# Patient Record
Sex: Female | Born: 1965 | Race: Black or African American | Hispanic: No | Marital: Single
Health system: Midwestern US, Community
[De-identification: ages and names within clinical notes are randomized; demographics above are authoritative.]

## PROBLEM LIST (undated history)

## (undated) DIAGNOSIS — I10 Essential (primary) hypertension: Secondary | ICD-10-CM

## (undated) DIAGNOSIS — F32A Depression, unspecified: Secondary | ICD-10-CM

## (undated) DIAGNOSIS — K219 Gastro-esophageal reflux disease without esophagitis: Secondary | ICD-10-CM

## (undated) DIAGNOSIS — F329 Major depressive disorder, single episode, unspecified: Secondary | ICD-10-CM

## (undated) DIAGNOSIS — G43909 Migraine, unspecified, not intractable, without status migrainosus: Secondary | ICD-10-CM

## (undated) DIAGNOSIS — E669 Obesity, unspecified: Secondary | ICD-10-CM

## (undated) DIAGNOSIS — R011 Cardiac murmur, unspecified: Secondary | ICD-10-CM

## (undated) DIAGNOSIS — D259 Leiomyoma of uterus, unspecified: Secondary | ICD-10-CM

## (undated) DIAGNOSIS — H4710 Unspecified papilledema: Secondary | ICD-10-CM

## (undated) DIAGNOSIS — G932 Benign intracranial hypertension: Secondary | ICD-10-CM

## (undated) HISTORY — PX: WISDOM TOOTH EXTRACTION: SHX21

## (undated) HISTORY — PX: CHOLECYSTECTOMY: SHX55

---

## 2001-03-18 ENCOUNTER — Emergency Department (HOSPITAL_COMMUNITY): Admission: EM | Admit: 2001-03-18 | Discharge: 2001-03-18 | Payer: Self-pay | Admitting: Emergency Medicine

## 2001-04-15 ENCOUNTER — Encounter: Payer: Self-pay | Admitting: Emergency Medicine

## 2001-04-15 ENCOUNTER — Emergency Department (HOSPITAL_COMMUNITY): Admission: EM | Admit: 2001-04-15 | Discharge: 2001-04-15 | Payer: Self-pay | Admitting: Emergency Medicine

## 2001-05-21 ENCOUNTER — Emergency Department (HOSPITAL_COMMUNITY): Admission: EM | Admit: 2001-05-21 | Discharge: 2001-05-21 | Payer: Self-pay | Admitting: Emergency Medicine

## 2001-06-07 ENCOUNTER — Inpatient Hospital Stay (HOSPITAL_COMMUNITY): Admission: AD | Admit: 2001-06-07 | Discharge: 2001-06-07 | Payer: Self-pay | Admitting: *Deleted

## 2001-08-04 ENCOUNTER — Emergency Department (HOSPITAL_COMMUNITY): Admission: EM | Admit: 2001-08-04 | Discharge: 2001-08-04 | Payer: Self-pay | Admitting: Emergency Medicine

## 2001-09-26 ENCOUNTER — Emergency Department (HOSPITAL_COMMUNITY): Admission: EM | Admit: 2001-09-26 | Discharge: 2001-09-27 | Payer: Self-pay | Admitting: Emergency Medicine

## 2001-10-24 ENCOUNTER — Emergency Department (HOSPITAL_COMMUNITY): Admission: EM | Admit: 2001-10-24 | Discharge: 2001-10-24 | Payer: Self-pay | Admitting: Emergency Medicine

## 2002-01-02 ENCOUNTER — Encounter: Payer: Self-pay | Admitting: Emergency Medicine

## 2002-01-02 ENCOUNTER — Emergency Department (HOSPITAL_COMMUNITY): Admission: EM | Admit: 2002-01-02 | Discharge: 2002-01-02 | Payer: Self-pay | Admitting: Emergency Medicine

## 2002-02-10 ENCOUNTER — Encounter (INDEPENDENT_AMBULATORY_CARE_PROVIDER_SITE_OTHER): Payer: Self-pay | Admitting: Cardiology

## 2002-02-10 ENCOUNTER — Ambulatory Visit (HOSPITAL_COMMUNITY): Admission: RE | Admit: 2002-02-10 | Discharge: 2002-02-10 | Payer: Self-pay | Admitting: Pulmonary Disease

## 2002-03-31 ENCOUNTER — Emergency Department (HOSPITAL_COMMUNITY): Admission: EM | Admit: 2002-03-31 | Discharge: 2002-03-31 | Payer: Self-pay | Admitting: Emergency Medicine

## 2002-03-31 ENCOUNTER — Encounter: Payer: Self-pay | Admitting: Emergency Medicine

## 2002-04-28 ENCOUNTER — Ambulatory Visit (HOSPITAL_COMMUNITY): Admission: RE | Admit: 2002-04-28 | Discharge: 2002-04-28 | Payer: Self-pay | Admitting: Pulmonary Disease

## 2002-04-28 ENCOUNTER — Encounter: Payer: Self-pay | Admitting: Pulmonary Disease

## 2002-07-09 ENCOUNTER — Ambulatory Visit (HOSPITAL_COMMUNITY): Admission: RE | Admit: 2002-07-09 | Discharge: 2002-07-09 | Payer: Self-pay | Admitting: Pulmonary Disease

## 2002-07-09 ENCOUNTER — Encounter: Payer: Self-pay | Admitting: Pulmonary Disease

## 2002-08-02 ENCOUNTER — Emergency Department (HOSPITAL_COMMUNITY): Admission: EM | Admit: 2002-08-02 | Discharge: 2002-08-02 | Payer: Self-pay | Admitting: Emergency Medicine

## 2002-08-10 ENCOUNTER — Emergency Department (HOSPITAL_COMMUNITY): Admission: EM | Admit: 2002-08-10 | Discharge: 2002-08-10 | Payer: Self-pay | Admitting: *Deleted

## 2002-11-11 ENCOUNTER — Emergency Department (HOSPITAL_COMMUNITY): Admission: AD | Admit: 2002-11-11 | Discharge: 2002-11-11 | Payer: Self-pay | Admitting: Family Medicine

## 2002-11-13 ENCOUNTER — Emergency Department (HOSPITAL_COMMUNITY): Admission: EM | Admit: 2002-11-13 | Discharge: 2002-11-13 | Payer: Self-pay | Admitting: Family Medicine

## 2002-12-03 ENCOUNTER — Emergency Department (HOSPITAL_COMMUNITY): Admission: EM | Admit: 2002-12-03 | Discharge: 2002-12-03 | Payer: Self-pay | Admitting: Emergency Medicine

## 2003-06-09 ENCOUNTER — Emergency Department (HOSPITAL_COMMUNITY): Admission: EM | Admit: 2003-06-09 | Discharge: 2003-06-10 | Payer: Self-pay

## 2003-06-09 ENCOUNTER — Emergency Department (HOSPITAL_COMMUNITY): Admission: EM | Admit: 2003-06-09 | Discharge: 2003-06-09 | Payer: Self-pay | Admitting: Family Medicine

## 2003-07-29 ENCOUNTER — Emergency Department (HOSPITAL_COMMUNITY): Admission: EM | Admit: 2003-07-29 | Discharge: 2003-07-30 | Payer: Self-pay | Admitting: Emergency Medicine

## 2003-10-01 ENCOUNTER — Ambulatory Visit (HOSPITAL_COMMUNITY): Admission: RE | Admit: 2003-10-01 | Discharge: 2003-10-01 | Payer: Self-pay | Admitting: Pulmonary Disease

## 2004-02-23 ENCOUNTER — Ambulatory Visit (HOSPITAL_COMMUNITY): Admission: RE | Admit: 2004-02-23 | Discharge: 2004-02-23 | Payer: Self-pay | Admitting: Pulmonary Disease

## 2004-03-12 ENCOUNTER — Emergency Department (HOSPITAL_COMMUNITY): Admission: EM | Admit: 2004-03-12 | Discharge: 2004-03-13 | Payer: Self-pay | Admitting: *Deleted

## 2004-03-14 ENCOUNTER — Encounter: Admission: RE | Admit: 2004-03-14 | Discharge: 2004-04-20 | Payer: Self-pay | Admitting: Pulmonary Disease

## 2004-03-30 ENCOUNTER — Other Ambulatory Visit: Admission: RE | Admit: 2004-03-30 | Discharge: 2004-03-30 | Payer: Self-pay | Admitting: Obstetrics and Gynecology

## 2004-04-20 ENCOUNTER — Encounter: Admission: RE | Admit: 2004-04-20 | Discharge: 2004-05-12 | Payer: Self-pay | Admitting: Pulmonary Disease

## 2004-06-08 ENCOUNTER — Ambulatory Visit (HOSPITAL_COMMUNITY): Admission: RE | Admit: 2004-06-08 | Discharge: 2004-06-08 | Payer: Self-pay | Admitting: Obstetrics and Gynecology

## 2004-06-08 ENCOUNTER — Encounter (INDEPENDENT_AMBULATORY_CARE_PROVIDER_SITE_OTHER): Payer: Self-pay | Admitting: Specialist

## 2004-09-13 ENCOUNTER — Emergency Department (HOSPITAL_COMMUNITY): Admission: EM | Admit: 2004-09-13 | Discharge: 2004-09-13 | Payer: Self-pay | Admitting: Emergency Medicine

## 2004-09-16 ENCOUNTER — Emergency Department (HOSPITAL_COMMUNITY): Admission: EM | Admit: 2004-09-16 | Discharge: 2004-09-16 | Payer: Self-pay | Admitting: Family Medicine

## 2005-04-02 ENCOUNTER — Emergency Department (HOSPITAL_COMMUNITY): Admission: EM | Admit: 2005-04-02 | Discharge: 2005-04-02 | Payer: Self-pay | Admitting: Family Medicine

## 2005-04-23 ENCOUNTER — Emergency Department (HOSPITAL_COMMUNITY): Admission: EM | Admit: 2005-04-23 | Discharge: 2005-04-23 | Payer: Self-pay | Admitting: Family Medicine

## 2005-06-19 ENCOUNTER — Other Ambulatory Visit: Admission: RE | Admit: 2005-06-19 | Discharge: 2005-06-19 | Payer: Self-pay | Admitting: Obstetrics and Gynecology

## 2005-09-14 ENCOUNTER — Emergency Department (HOSPITAL_COMMUNITY): Admission: EM | Admit: 2005-09-14 | Discharge: 2005-09-14 | Payer: Self-pay | Admitting: Family Medicine

## 2005-11-03 ENCOUNTER — Emergency Department (HOSPITAL_COMMUNITY): Admission: EM | Admit: 2005-11-03 | Discharge: 2005-11-03 | Payer: Self-pay | Admitting: Emergency Medicine

## 2005-11-09 ENCOUNTER — Emergency Department (HOSPITAL_COMMUNITY): Admission: EM | Admit: 2005-11-09 | Discharge: 2005-11-09 | Payer: Self-pay | Admitting: Family Medicine

## 2006-03-19 ENCOUNTER — Ambulatory Visit (HOSPITAL_BASED_OUTPATIENT_CLINIC_OR_DEPARTMENT_OTHER): Admission: RE | Admit: 2006-03-19 | Discharge: 2006-03-19 | Payer: Self-pay | Admitting: Pulmonary Disease

## 2006-03-21 ENCOUNTER — Emergency Department (HOSPITAL_COMMUNITY): Admission: EM | Admit: 2006-03-21 | Discharge: 2006-03-21 | Payer: Self-pay | Admitting: Emergency Medicine

## 2006-03-24 ENCOUNTER — Ambulatory Visit: Payer: Self-pay | Admitting: Internal Medicine

## 2006-05-10 ENCOUNTER — Ambulatory Visit (HOSPITAL_BASED_OUTPATIENT_CLINIC_OR_DEPARTMENT_OTHER): Admission: RE | Admit: 2006-05-10 | Discharge: 2006-05-10 | Payer: Self-pay | Admitting: Pulmonary Disease

## 2006-05-18 ENCOUNTER — Emergency Department (HOSPITAL_COMMUNITY): Admission: EM | Admit: 2006-05-18 | Discharge: 2006-05-18 | Payer: Self-pay | Admitting: Emergency Medicine

## 2006-06-11 ENCOUNTER — Emergency Department (HOSPITAL_COMMUNITY): Admission: EM | Admit: 2006-06-11 | Discharge: 2006-06-11 | Payer: Self-pay | Admitting: Family Medicine

## 2007-04-05 ENCOUNTER — Emergency Department (HOSPITAL_COMMUNITY): Admission: EM | Admit: 2007-04-05 | Discharge: 2007-04-05 | Payer: Self-pay | Admitting: Family Medicine

## 2008-07-18 ENCOUNTER — Emergency Department (HOSPITAL_COMMUNITY): Admission: EM | Admit: 2008-07-18 | Discharge: 2008-07-18 | Payer: Self-pay | Admitting: Emergency Medicine

## 2008-08-22 ENCOUNTER — Emergency Department (HOSPITAL_COMMUNITY): Admission: EM | Admit: 2008-08-22 | Discharge: 2008-08-23 | Payer: Self-pay | Admitting: Emergency Medicine

## 2008-10-05 ENCOUNTER — Emergency Department (HOSPITAL_COMMUNITY): Admission: EM | Admit: 2008-10-05 | Discharge: 2008-10-05 | Payer: Self-pay | Admitting: Emergency Medicine

## 2008-12-27 ENCOUNTER — Emergency Department (HOSPITAL_COMMUNITY): Admission: EM | Admit: 2008-12-27 | Discharge: 2008-12-28 | Payer: Self-pay | Admitting: Emergency Medicine

## 2009-02-17 ENCOUNTER — Emergency Department (HOSPITAL_COMMUNITY): Admission: EM | Admit: 2009-02-17 | Discharge: 2009-02-17 | Payer: Self-pay | Admitting: Family Medicine

## 2009-06-10 ENCOUNTER — Emergency Department (HOSPITAL_COMMUNITY): Admission: EM | Admit: 2009-06-10 | Discharge: 2009-06-10 | Payer: Self-pay | Admitting: Emergency Medicine

## 2010-02-05 ENCOUNTER — Encounter: Payer: Self-pay | Admitting: Neurology

## 2010-02-05 ENCOUNTER — Encounter: Payer: Self-pay | Admitting: Pulmonary Disease

## 2010-04-03 LAB — CBC
Hemoglobin: 12.4 g/dL (ref 12.0–15.0)
MCV: 88.1 fL (ref 78.0–100.0)
RBC: 4.18 MIL/uL (ref 3.87–5.11)

## 2010-04-03 LAB — DIFFERENTIAL
Eosinophils Relative: 3 % (ref 0–5)
Lymphocytes Relative: 21 % (ref 12–46)
Monocytes Absolute: 0.5 10*3/uL (ref 0.1–1.0)
Monocytes Relative: 6 % (ref 3–12)
Neutro Abs: 5.7 10*3/uL (ref 1.7–7.7)

## 2010-04-03 LAB — COMPREHENSIVE METABOLIC PANEL
ALT: 14 U/L (ref 0–35)
Alkaline Phosphatase: 48 U/L (ref 39–117)
Chloride: 102 mEq/L (ref 96–112)
GFR calc Af Amer: >60 mL/min (ref >60)
Glucose, Bld: 117 mg/dL — ABNORMAL HIGH (ref 70–99)
Sodium: 138 mEq/L (ref 135–145)

## 2010-04-03 LAB — CK TOTAL AND CKMB (NOT AT ARMC)
CK, MB: 1.2 ng/mL (ref 0.3–4.0)
Total CK: 99 U/L (ref 7–177)

## 2010-06-01 ENCOUNTER — Emergency Department (HOSPITAL_COMMUNITY)
Admission: EM | Admit: 2010-06-01 | Discharge: 2010-06-02 | Disposition: A | Discharge status: Home / Self Care | Payer: PRIVATE HEALTH INSURANCE | Attending: Emergency Medicine | Admitting: Emergency Medicine

## 2010-06-01 DIAGNOSIS — R110 Nausea: Secondary | ICD-10-CM | POA: Insufficient documentation

## 2010-06-01 DIAGNOSIS — E669 Obesity, unspecified: Secondary | ICD-10-CM | POA: Insufficient documentation

## 2010-06-01 DIAGNOSIS — H53149 Visual discomfort, unspecified: Secondary | ICD-10-CM | POA: Insufficient documentation

## 2010-06-01 DIAGNOSIS — R51 Headache: Secondary | ICD-10-CM | POA: Insufficient documentation

## 2010-06-01 DIAGNOSIS — I10 Essential (primary) hypertension: Secondary | ICD-10-CM | POA: Insufficient documentation

## 2010-06-02 NOTE — Procedures (Signed)
Courtney Rhodes, Courtney Rhodes                ACCOUNT NO.:  [id number]   MEDICAL RECORD NO.:  [id number]          PATIENT TYPE:  OUT   LOCATION:  SLEEP CENTER                 FACILITY:  [facility]   PHYSICIAN:  Clinton D. Maple Hudson, MD, FCCP, FACPDATE OF BIRTH:  1966-08-22   DATE OF STUDY:  05/10/2006                            NOCTURNAL POLYSOMNOGRAM   REFERRING PHYSICIAN:  Mina Marble, M.D.   INDICATION FOR STUDY:  Hypersomnia with sleep apnea.   EPWORTH SLEEPINESS SCORE:  18/24.  Height 5 feet 7 inches, weight 290  pounds.  A baseline diagnostic study on March 21, 2006, recorded an AHI  of 39.9 per hour.  CPAP titration requested.   SLEEP ARCHITECTURE:  Total sleep time 333 minutes with sleep efficiency  93%.  Stage I was 4%, stage II 58%, stages III and IV 2%, REM 37% of  total sleep time.  Sleep latency 16 minutes, REM latency 59 minutes.  Awake after sleep onset 10 minutes, arousal index 0.9.  No bedtime  medication was taken.   RESPIRATORY DATA:  CPAP titration protocol.  CPAP was titrated to 21  CWP.  Obstructive events were prevented by pressures of 17 CWP and  higher.  Pressure was increased to suppress snoring.  A medium ResMed  Quattro was used with heated humidifier.   OXYGEN DATA:  Final CPAP pressure suppressed snoring with oxygen  saturation holding at 98% on room air.   CARDIAC DATA:  Normal sinus rhythm.   MOVEMENT-PARASOMNIA:  Significant movement disturbance.   IMPRESSIONS-RECOMMENDATIONS:  1. CPAP titration required a final pressure of 21 CWP to completely      suppress snoring (AHI zero per hour).  Most pressures provided      substantial event control.  Suggest initial home trial would be      more comfortable and would be able to utilize a standard CPAP      machine and a suggested initial pressure of 17 CWP (AHI zero per      hour).  Pressures higher than 20 are likely to require a bilevel      machine.  A medium ResMed Quattro was chosen for this study.  2.  Baseline diagnostic NPSG on March 21, 2006, had recorded an AHI of      39.9 per hour.     Clinton D. Maple Hudson, MD, [facility], FACP  Diplomate, Biomedical engineer of Sleep Medicine  Electronically Signed    CDY/MEDQ  D:  05/12/2006 11:38:20  T:  05/12/2006 16:27:23  Job:  [id number]

## 2010-06-02 NOTE — Procedures (Signed)
Courtney Rhodes, Courtney Rhodes                ACCOUNT NO.:  [id number]   MEDICAL RECORD NO.:  [id number]          PATIENT TYPE:  OUT   LOCATION:  SLEEP CENTER                 FACILITY:  [facility]   PHYSICIAN:  Clinton D. Maple Hudson, MD, FCCP, FACPDATE OF BIRTH:  12/17/1966   DATE OF STUDY:  03/21/2006                            NOCTURNAL POLYSOMNOGRAM   INDICATION FOR STUDY:  Hypersomnia with sleep apnea.   EPWORTH SLEEPINESS SCORE:  18/24, BMI 44, weight 285 pounds.   MEDICATIONS:  No home medications were listed.   SLEEP ARCHITECTURE:  Total sleep time 305 minutes with sleep efficiency  72%.  Stage 1 was 4%, stage II 88%, stages III and IV absent, REM 8% of  total sleep time.  Sleep latency 111 minutes, REM latency 191 minutes,  awake after sleep onset 11 minutes, arousal index 1.  No bedtime  medication was reported.   RESPIRATORY DATA:  Apnea-hypopnea index (AHI, RDI) 39.9 obstructive  events per hour, indicating moderately severe obstructive sleep  apnea/hypopnea syndrome.  There were 33 obstructive sleep apneas and 170  hypopneas.  Events occurred relatively late in the night, preventing  CPAP titration by split protocol on this study night.  Events were not  positional, although somewhat worse while supine.  REM AHI 52.8.   OXYGEN DATA:  Moderate to loud snoring with oxygen desaturation to a  nadir of 83%.  Mean oxygen saturation through the study was 94% on room  air.   CARDIAC DATA:  Normal sinus rhythm.   MOVEMENT-PARASOMNIA:  No significant limb movement or movement  disturbance.   IMPRESSIONS-RECOMMENDATIONS:  1. Moderately severe obstructive sleep apnea/hypopnea syndrome, apnea-      hypopnea index 39.9 per hour with non-positional events, moderate      to loud snoring and oxygen desaturation to a nadir of 83%.  Sleep      onset was delayed until nearly midnight and patient reported that      she had taken a nap at 5 p.m. despite instructions from the sleep      center before the  study.  2. Onset of events was too late in the study night to permit use of      CPAP titration by split protocol.  Suggest return for continuous      positive airway pressure titration or evaluate for alternative      therapies as appropriate.      Clinton D. Maple Hudson, MD, [facility], FACP  Diplomate, Biomedical engineer of Sleep Medicine  Electronically Signed     CDY/MEDQ  D:  03/24/2006 10:20:22  T:  03/24/2006 17:53:37  Job:  [id number]

## 2010-06-02 NOTE — Op Note (Signed)
NAMERAAGA, MAEDER                ACCOUNT NO.:  [id number]   MEDICAL RECORD NO.:  [id number]          PATIENT TYPE:  AMB   LOCATION:  SDC                           FACILITY:  [facility]   PHYSICIAN:  Osborn Coho, M.D.   DATE OF BIRTH:  [birth year]   DATE OF PROCEDURE:  06/08/2004  DATE OF DISCHARGE:                                 OPERATIVE REPORT   PREOPERATIVE DIAGNOSIS:  Menorrhagia.   POSTOPERATIVE DIAGNOSIS:  Menorrhagia.   PROCEDURE:  Hysteroscopy, dilatation and curettage, and endometrial ablation  via NovaSure.   ATTENDING:  Osborn Coho, M.D.   FLUIDS:  600 mL.   URINE OUTPUT:  Approximately 200 mL via straight catheter prior to  procedure.   HYSTEROSCOPIC FLUID DEFICIT:  110 mL.   ESTIMATED BLOOD LOSS:  Minimal.   ANESTHESIA:  General.   COMPLICATIONS:  None.   SPECIMENS TO PATHOLOGY:  Endometrial curettings.   FINDINGS:  The uterus sounded to 11 cm.  Cervical length 4.5 cm.  Cavity  length 6.5 cm.  Cavity width 4.2 cm.  Power 150 watts.  Time 1 minute 6  seconds.   PROCEDURE:  The patient was taken to the operating room after the risks,  benefits, and alternatives were reviewed with the patient and the patient  verbalized understanding and consent signed and witnessed.  The patient was  placed under general anesthesia in a dorsal lithotomy position and prepped  and draped in the normal sterile fashion.  A bivalve speculum was placed in  the patient's vagina and a total of 10 mL of 1% lidocaine was used to  administer a paracervical block.  A single-tooth tenaculum was placed on the  anterior lip of the cervix and the cervical length and uterine sound as  mentioned above.  The cervix was dilated for passage of the diagnostic  hysteroscope.  Diagnostic hysteroscope introduced and polypoid lesions  noted.  Curettage was performed and curettings sent to pathology.  The  NovaSure instrument was then introduced and cavity assessment passed.  Length of ablation 1  minute 6  seconds.  NovaSure instrument was removed.  The tenaculum was removed as  well.  There was some bleeding from the left tenaculum site, and silver  nitrate was applied for hemostasis.  The count was correct.  All instruments  removed.  The patient tolerated the procedure well and was returned to the  recovery room in good condition.       AR/MEDQ  D:  06/08/2004  T:  06/08/2004  Job:  [id number]

## 2010-06-19 ENCOUNTER — Other Ambulatory Visit (HOSPITAL_COMMUNITY): Payer: Self-pay | Admitting: Pulmonary Disease

## 2010-06-19 DIAGNOSIS — Z1231 Encounter for screening mammogram for malignant neoplasm of breast: Secondary | ICD-10-CM

## 2010-06-21 ENCOUNTER — Ambulatory Visit (HOSPITAL_COMMUNITY): Admission: RE | Admit: 2010-06-21 | Payer: PRIVATE HEALTH INSURANCE | Source: Ambulatory Visit

## 2010-06-21 ENCOUNTER — Ambulatory Visit
Admission: RE | Admit: 2010-06-21 | Discharge: 2010-06-21 | Disposition: A | Discharge status: Home / Self Care | Payer: PRIVATE HEALTH INSURANCE | Source: Ambulatory Visit | Attending: Pulmonary Disease | Admitting: Pulmonary Disease

## 2010-06-21 ENCOUNTER — Other Ambulatory Visit: Payer: Self-pay | Admitting: Pulmonary Disease

## 2010-06-21 DIAGNOSIS — Z1231 Encounter for screening mammogram for malignant neoplasm of breast: Secondary | ICD-10-CM

## 2010-06-30 ENCOUNTER — Ambulatory Visit (HOSPITAL_COMMUNITY): Payer: PRIVATE HEALTH INSURANCE

## 2012-01-19 ENCOUNTER — Encounter (HOSPITAL_COMMUNITY): Payer: Self-pay | Admitting: Emergency Medicine

## 2012-01-19 ENCOUNTER — Observation Stay (HOSPITAL_COMMUNITY)
Admission: EM | Admit: 2012-01-19 | Discharge: 2012-01-20 | Disposition: A | Discharge status: Home / Self Care | Payer: Medicaid Other | Attending: Family Medicine | Admitting: Family Medicine

## 2012-01-19 ENCOUNTER — Emergency Department (HOSPITAL_COMMUNITY): Payer: PRIVATE HEALTH INSURANCE

## 2012-01-19 DIAGNOSIS — Z79899 Other long term (current) drug therapy: Secondary | ICD-10-CM | POA: Insufficient documentation

## 2012-01-19 DIAGNOSIS — G43909 Migraine, unspecified, not intractable, without status migrainosus: Secondary | ICD-10-CM | POA: Insufficient documentation

## 2012-01-19 DIAGNOSIS — I517 Cardiomegaly: Secondary | ICD-10-CM | POA: Insufficient documentation

## 2012-01-19 DIAGNOSIS — M549 Dorsalgia, unspecified: Secondary | ICD-10-CM | POA: Insufficient documentation

## 2012-01-19 DIAGNOSIS — R110 Nausea: Secondary | ICD-10-CM | POA: Insufficient documentation

## 2012-01-19 DIAGNOSIS — I519 Heart disease, unspecified: Secondary | ICD-10-CM

## 2012-01-19 DIAGNOSIS — K219 Gastro-esophageal reflux disease without esophagitis: Secondary | ICD-10-CM | POA: Diagnosis present

## 2012-01-19 DIAGNOSIS — I5189 Other ill-defined heart diseases: Secondary | ICD-10-CM

## 2012-01-19 DIAGNOSIS — R350 Frequency of micturition: Secondary | ICD-10-CM | POA: Insufficient documentation

## 2012-01-19 DIAGNOSIS — I079 Rheumatic tricuspid valve disease, unspecified: Secondary | ICD-10-CM | POA: Insufficient documentation

## 2012-01-19 DIAGNOSIS — I10 Essential (primary) hypertension: Secondary | ICD-10-CM | POA: Diagnosis present

## 2012-01-19 DIAGNOSIS — R079 Chest pain, unspecified: Principal | ICD-10-CM | POA: Diagnosis present

## 2012-01-19 DIAGNOSIS — E876 Hypokalemia: Secondary | ICD-10-CM | POA: Insufficient documentation

## 2012-01-19 HISTORY — DX: Essential (primary) hypertension: I10

## 2012-01-19 HISTORY — DX: Cardiac murmur, unspecified: R01.1

## 2012-01-19 LAB — CBC
HCT: 35.9 % — ABNORMAL LOW (ref 36.0–46.0)
Hemoglobin: 12.1 g/dL (ref 12.0–15.0)
RDW: 13.9 % (ref 11.5–15.5)
WBC: 9.1 10*3/uL (ref 4.0–10.5)

## 2012-01-19 LAB — TROPONIN I: Troponin I: <0.3 ng/mL (ref <0.30)

## 2012-01-19 LAB — URINALYSIS, ROUTINE W REFLEX MICROSCOPIC
Glucose, UA: NEGATIVE mg/dL
Hgb urine dipstick: NEGATIVE

## 2012-01-19 LAB — CK TOTAL AND CKMB (NOT AT ARMC): Total CK: 73 U/L (ref 7–177)

## 2012-01-19 LAB — CREATININE, SERUM
Creatinine, Ser: 0.7 mg/dL (ref 0.50–1.10)
GFR calc Af Amer: >90 mL/min (ref >90)
GFR calc non Af Amer: >90 mL/min (ref >90)

## 2012-01-19 LAB — CBC WITH DIFFERENTIAL/PLATELET
Basophils Relative: 0 % (ref 0–1)
Eosinophils Absolute: 0.3 10*3/uL (ref 0.0–0.7)
Lymphocytes Relative: 31 % (ref 12–46)
MCH: 29.3 pg (ref 26.0–34.0)
MCV: 87.3 fL (ref 78.0–100.0)
Monocytes Relative: 6 % (ref 3–12)
Platelets: 357 10*3/uL (ref 150–400)
RDW: 13.9 % (ref 11.5–15.5)

## 2012-01-19 LAB — PRO B NATRIURETIC PEPTIDE: Pro B Natriuretic peptide (BNP): 269.7 pg/mL — ABNORMAL HIGH (ref 0–125)

## 2012-01-19 LAB — D-DIMER, QUANTITATIVE: D-Dimer, Quant: 0.33 ug/mL-FEU (ref 0.00–0.48)

## 2012-01-19 LAB — BASIC METABOLIC PANEL
CO2: 28 mEq/L (ref 19–32)
Chloride: 99 mEq/L (ref 96–112)
GFR calc Af Amer: >90 mL/min (ref >90)
GFR calc non Af Amer: >90 mL/min (ref >90)
Glucose, Bld: 136 mg/dL — ABNORMAL HIGH (ref 70–99)

## 2012-01-19 MED ORDER — ALUM & MAG HYDROXIDE-SIMETH 200-200-20 MG/5ML PO SUSP
30.0000 mL | Freq: Once | ORAL | Status: AC
  Administered 2012-01-19: 30 mL via ORAL
  Filled 2012-01-19: qty 30

## 2012-01-19 MED ORDER — ONDANSETRON HCL 4 MG PO TABS
4.0000 mg | ORAL_TABLET | Freq: Four times a day (QID) | ORAL | Status: DC | PRN
  Administered 2012-01-19: 4 mg via ORAL
  Filled 2012-01-19: qty 1

## 2012-01-19 MED ORDER — MORPHINE SULFATE 2 MG/ML IJ SOLN
1.0000 mg | INTRAMUSCULAR | Status: DC | PRN
  Administered 2012-01-19 – 2012-01-20 (×4): 1 mg via INTRAVENOUS
  Filled 2012-01-19 (×5): qty 1

## 2012-01-19 MED ORDER — ENOXAPARIN SODIUM 40 MG/0.4ML ~~LOC~~ SOLN
40.0000 mg | SUBCUTANEOUS | Status: DC
  Administered 2012-01-19 – 2012-01-20 (×2): 40 mg via SUBCUTANEOUS
  Filled 2012-01-19 (×2): qty 0.4

## 2012-01-19 MED ORDER — ASPIRIN EC 81 MG PO TBEC
81.0000 mg | DELAYED_RELEASE_TABLET | Freq: Every day | ORAL | Status: DC
  Administered 2012-01-19 – 2012-01-20 (×2): 81 mg via ORAL
  Filled 2012-01-19 (×2): qty 1

## 2012-01-19 MED ORDER — SODIUM CHLORIDE 0.9 % IJ SOLN: 3.0000 mL | INTRAMUSCULAR | Status: DC | PRN

## 2012-01-19 MED ORDER — MORPHINE SULFATE 4 MG/ML IJ SOLN
4.0000 mg | Freq: Once | INTRAMUSCULAR | Status: AC
  Administered 2012-01-19: 4 mg via INTRAVENOUS
  Filled 2012-01-19: qty 1

## 2012-01-19 MED ORDER — PANTOPRAZOLE SODIUM 40 MG IV SOLR
40.0000 mg | INTRAVENOUS | Status: DC
  Administered 2012-01-19 – 2012-01-20 (×2): 40 mg via INTRAVENOUS
  Filled 2012-01-19 (×2): qty 40

## 2012-01-19 MED ORDER — NITROGLYCERIN 0.4 MG SL SUBL
0.4000 mg | SUBLINGUAL_TABLET | Freq: Once | SUBLINGUAL | Status: AC
  Administered 2012-01-19: 0.4 mg via SUBLINGUAL
  Filled 2012-01-19: qty 25

## 2012-01-19 MED ORDER — MORPHINE SULFATE 2 MG/ML IJ SOLN
1.0000 mg | Freq: Once | INTRAMUSCULAR | Status: AC
Start: 2012-01-19 — End: 2012-01-19
  Administered 2012-01-19: 1 mg via INTRAVENOUS

## 2012-01-19 MED ORDER — SODIUM CHLORIDE 0.9 % IV SOLN: 250.0000 mL | INTRAVENOUS | Status: DC | PRN

## 2012-01-19 MED ORDER — ONDANSETRON HCL 4 MG/2ML IJ SOLN: 4.0000 mg | Freq: Four times a day (QID) | INTRAMUSCULAR | Status: DC | PRN

## 2012-01-19 MED ORDER — POTASSIUM CHLORIDE 20 MEQ/15ML (10%) PO LIQD
40.0000 meq | Freq: Once | ORAL | Status: AC
  Administered 2012-01-19: 40 meq via ORAL
  Filled 2012-01-19: qty 30

## 2012-01-19 MED ORDER — POTASSIUM CHLORIDE 20 MEQ/15ML (10%) PO LIQD
40.0000 meq | Freq: Every day | ORAL | Status: DC
  Administered 2012-01-19 – 2012-01-20 (×2): 40 meq via ORAL
  Filled 2012-01-19 (×2): qty 30

## 2012-01-19 MED ORDER — METOPROLOL TARTRATE 50 MG PO TABS
50.0000 mg | ORAL_TABLET | Freq: Two times a day (BID) | ORAL | Status: DC
  Administered 2012-01-19 – 2012-01-20 (×3): 50 mg via ORAL
  Filled 2012-01-19 (×4): qty 1

## 2012-01-19 MED ORDER — SODIUM CHLORIDE 0.9 % IV SOLN
INTRAVENOUS | Status: DC
  Administered 2012-01-19: 125 mL/h via INTRAVENOUS

## 2012-01-19 MED ORDER — POTASSIUM CHLORIDE 10 MEQ/100ML IV SOLN
10.0000 meq | INTRAVENOUS | Status: AC
  Administered 2012-01-19 (×2): 10 meq via INTRAVENOUS
  Filled 2012-01-19 (×2): qty 100

## 2012-01-19 MED ORDER — SODIUM CHLORIDE 0.9 % IJ SOLN
3.0000 mL | Freq: Two times a day (BID) | INTRAMUSCULAR | Status: DC
  Administered 2012-01-19 – 2012-01-20 (×3): 3 mL via INTRAVENOUS

## 2012-01-19 NOTE — ED Notes (Signed)
MD at bedside. 

## 2012-01-19 NOTE — ED Notes (Signed)
Pt requesting not to be admitted. Will notify admitting MD

## 2012-01-19 NOTE — H&P (Signed)
PCP:   No primary provider on file.   Chief Complaint:  Chest pain  HPI: 47 year old female with a history of hypertension who came to the hospital after patient woke up with left-sided chest pain which was radiating to her back, patient also complained of excessive urination within one hour. Patient experienced some mild shortness of breath when she came to the hospital. She admits to having nausea but no vomiting. She denies fever. At this time the pain has improved. Her facet of cardiac enzymes are negative EKG does not show significant abnormality.  Allergies:   Allergies  Allergen Reactions  . Ace Inhibitors       Past Medical History  Diagnosis Date  . Hypertension     History reviewed. No pertinent past surgical history.  Prior to Admission medications   Medication Sig Start Date End Date Taking? Authorizing Provider  metoprolol (LOPRESSOR) 100 MG tablet Take 100 mg by mouth daily.    Yes Historical Provider, MD    Social History:  reports that she has been smoking Cigarettes.  She has a 5 pack-year smoking history. She does not have any smokeless tobacco history on file. She reports that she drinks alcohol. She reports that she does not use illicit drugs.  Patient's father has significant cardiac history of MI  Review of Systems:  HEENT: Denies headache, blurred vision, runny nose, sore throat,  Neck: Denies thyroid problems,lymphadenopathy Chest : See history of present illness Heart : See history of present illness GI: Positive nausea, denies vomiting, diarrhea, constipation GU: Denies dysuria, urgency, positive frequency of urination, hematuria Neuro: Denies stroke, seizures, syncope Psych: Denies depression, anxiety, hallucinations   Physical Exam: Blood pressure 145/62, pulse 53, resp. rate 16, height 5\' 7"  (1.702 m), weight 131.543 kg (290 lb), last menstrual period 01/14/2012, SpO2 100.00%. Constitutional:   Patient is a well-developed and well-nourished  female in no acute distress and cooperative with exam. Head: Normocephalic and atraumatic Mouth: Mucus membranes moist Eyes: PERRL, EOMI, conjunctivae normal Neck: Supple, No Thyromegaly Cardiovascular: RRR, S1 normal, S2 normal Pulmonary/Chest: CTAB, no wheezes, rales, or rhonchi Abdominal: Soft. Non-tender, non-distended, bowel sounds are normal, no masses, organomegaly, or guarding present.  Neurological: A&O x3, Strenght is normal and symmetric bilaterally, cranial nerve II-XII are grossly intact, no focal motor deficit, sensory intact to light touch bilaterally.  Extremities : No Cyanosis, Clubbing or Edema   Labs on Admission:  Results for orders placed during the hospital encounter of 01/19/12 (from the past 48 hour(s))  CBC WITH DIFFERENTIAL     Status: Normal   Collection Time   01/19/12  6:05 AM      Component Value Range Comment   WBC 7.9  4.0 - 10.5 K/uL    RBC 4.26  3.87 - 5.11 MIL/uL    Hemoglobin 12.5  12.0 - 15.0 g/dL    HCT 16.1  09.6 - 04.5 %    MCV 87.3  78.0 - 100.0 fL    MCH 29.3  26.0 - 34.0 pg    MCHC 33.6  30.0 - 36.0 g/dL    RDW 40.9  81.1 - 91.4 %    Platelets 357  150 - 400 K/uL    Neutrophils Relative 59  43 - 77 %    Neutro Abs 4.7  1.7 - 7.7 K/uL    Lymphocytes Relative 31  12 - 46 %    Lymphs Abs 2.4  0.7 - 4.0 K/uL    Monocytes Relative 6  3 -  12 %    Monocytes Absolute 0.5  0.1 - 1.0 K/uL    Eosinophils Relative 3  0 - 5 %    Eosinophils Absolute 0.3  0.0 - 0.7 K/uL    Basophils Relative 0  0 - 1 %    Basophils Absolute 0.0  0.0 - 0.1 K/uL   D-DIMER, QUANTITATIVE     Status: Normal   Collection Time   01/19/12  6:05 AM      Component Value Range Comment   D-Dimer, Quant 0.33  0.00 - 0.48 ug/mL-FEU   BASIC METABOLIC PANEL     Status: Abnormal   Collection Time   01/19/12  6:05 AM      Component Value Range Comment   Sodium 136  135 - 145 mEq/L    Potassium 3.1 (*) 3.5 - 5.1 mEq/L    Chloride 99  96 - 112 mEq/L    CO2 28  19 - 32 mEq/L     Glucose, Bld 136 (*) 70 - 99 mg/dL    BUN 9  6 - 23 mg/dL    Creatinine, Ser 1.61  0.50 - 1.10 mg/dL    Calcium 9.3  8.4 - 09.6 mg/dL    GFR calc non Af Amer >90  >90 mL/min    GFR calc Af Amer >90  >90 mL/min   TROPONIN I     Status: Normal   Collection Time   01/19/12  6:05 AM      Component Value Range Comment   Troponin I <0.30  <0.30 ng/mL   URINALYSIS, ROUTINE W REFLEX MICROSCOPIC     Status: Abnormal   Collection Time   01/19/12  6:24 AM      Component Value Range Comment   Color, Urine YELLOW  YELLOW    APPearance CLEAR  CLEAR    Specific Gravity, Urine 1.013  1.005 - 1.030    pH 6.5  5.0 - 8.0    Glucose, UA NEGATIVE  NEGATIVE mg/dL    Hgb urine dipstick NEGATIVE  NEGATIVE    Bilirubin Urine MODERATE (*) NEGATIVE    Ketones, ur NEGATIVE  NEGATIVE mg/dL    Protein, ur NEGATIVE  NEGATIVE mg/dL    Urobilinogen, UA 0.2  0.0 - 1.0 mg/dL    Nitrite NEGATIVE  NEGATIVE    Leukocytes, UA NEGATIVE  NEGATIVE MICROSCOPIC NOT DONE ON URINES WITH NEGATIVE PROTEIN, BLOOD, LEUKOCYTES, NITRITE, OR GLUCOSE <1000 mg/dL.  PREGNANCY, URINE     Status: Normal   Collection Time   01/19/12  6:24 AM      Component Value Range Comment   Preg Test, Ur NEGATIVE  NEGATIVE     Radiological Exams on Admission: Dg Chest 2 View  01/19/2012  *RADIOLOGY REPORT*  Clinical Data: Left chest pain.  Shortness of breath.  CHEST - 2 VIEW  Comparison: 06/10/2009  Findings: Mild cardiomegaly is stable.  Both lungs are clear.  No evidence of pleural effusion.  No mass or lymphadenopathy identified.  IMPRESSION: Stable mild cardiomegaly.  No active lung disease.   Original Report Authenticated By: Myles Rosenthal, M.D.     Assessment/Plan  Chest pain rule out acute coronary syndrome Cardiomegaly Hypertension GERD  Chest pain Will admit the patient and do 3 sets of cardiac enzymes. We'll monitor her in telemetry. Patient had a recent cardiac stress test in October which was negative  Cardiomegaly  Chest x-ray shows  cardiomegaly Will obtain BNP and 2-D echo to rule out CHF  Hypertension Continue  with metoprolol, and change the dose to 50 mg by mouth twice a day instead of 100 mg of metoprolol tartrate  GERD Chronic 40 mg IV daily  DVT prophylaxis Lovenox  Time Spent on Admission: 70 minutes  Peachie Barkalow S [facility] Pager: [phone] 01/19/2012, 8:58 AM

## 2012-01-19 NOTE — ED Provider Notes (Signed)
History     CSN: [id number]  Arrival date & time 01/19/12  0542   First MD Initiated Contact with Patient 01/19/12 [phone]      Chief Complaint  Patient presents with  . Chest Pain    (Consider location/radiation/quality/duration/timing/severity/associated sxs/prior treatment) Patient is a 47 y.o. female presenting with chest pain. The history is provided by the patient.  Chest Pain The chest pain began less than 1 hour ago. Primary symptoms include shortness of breath. Pertinent negatives for primary symptoms include no abdominal pain, no nausea and no vomiting.  Pertinent negatives for associated symptoms include no numbness and no weakness.    patient woke with severe left-sided chest pain this morning. It is somewhat sharp. It is constant. She states it is in the left lower chest and does do to the back. She's also had some shortness of breath. She states she had a similar episode 3 months ago that resolved on its own. No fevers. No cough. She has some chronic swelling in her legs. No abdominal pain. She smokes. She states she has a history of hypertension. She states she has a cardiologist that she is supposed to see on Monday in [place]. She states she has never had heart attack. She does not think she has other heart problems, but states she doesn't know.  Past Medical History  Diagnosis Date  . Hypertension   . Heart murmur     History reviewed. No pertinent past surgical history.  History reviewed. No pertinent family history.  History  Substance Use Topics  . Smoking status: Current Every Day Smoker -- 1.0 packs/day for 5 years    Types: Cigarettes  . Smokeless tobacco: Not on file  . Alcohol Use: Yes     Comment: occasional    OB History    Grav Para Term Preterm Abortions TAB SAB Ect Mult Living                  Review of Systems  Constitutional: Negative for activity change and appetite change.  HENT: Negative for neck stiffness.   Eyes: Negative for pain.    Respiratory: Positive for shortness of breath. Negative for chest tightness.   Cardiovascular: Positive for chest pain and leg swelling.  Gastrointestinal: Negative for nausea, vomiting, abdominal pain and diarrhea.  Genitourinary: Negative for flank pain.  Musculoskeletal: Negative for back pain.  Skin: Negative for rash.  Neurological: Negative for weakness, numbness and headaches.  Psychiatric/Behavioral: Negative for behavioral problems.    Allergies  Ace inhibitors  Home Medications   Current Outpatient Rx  Name  Route  Sig  Dispense  Refill  . AMLODIPINE BESYLATE 10 MG PO TABS   Oral   Take 10 mg by mouth daily.         . ASPIRIN EC 81 MG PO TBEC   Oral   Take 81 mg by mouth daily.         Marland Kitchen BUTALBITAL-APAP-CAFFEINE 50-325-40 MG PO TABS   Oral   Take 1 tablet by mouth as directed. 15 tablets must last 30 days         . BUTORPHANOL TARTRATE 10 MG/ML NA SOLN   Nasal   Place 1 spray into the nose every 4 (four) hours as needed. pain         . CLONIDINE HCL 0.3 MG PO TABS   Oral   Take 0.3 mg by mouth 2 (two) times daily.         Marland Kitchen VITAMIN  B 12 PO   Oral   Take 1 mL by mouth daily.         . CYCLOBENZAPRINE HCL 10 MG PO TABS   Oral   Take 10 mg by mouth at bedtime.         Marland Kitchen DIAZEPAM 5 MG PO TABS   Oral   Take 5-10 mg by mouth 2 (two) times daily. Takes 1 tablet in the morning and 2 at bedtime         . ETODOLAC 500 MG PO TABS   Oral   Take 500 mg by mouth 2 (two) times daily.         Marland Kitchen FERROUS FUMARATE 325 (106 FE) MG PO TABS   Oral   Take 1 tablet by mouth.         . FLUOXETINE HCL 40 MG PO CAPS   Oral   Take 40 mg by mouth 2 (two) times daily.          Marland Kitchen FLUTICASONE PROPIONATE 50 MCG/ACT NA SUSP   Nasal   Place 2 sprays into the nose 2 (two) times daily.          Marland Kitchen FOLIC ACID 1 MG PO TABS   Oral   Take 1 mg by mouth daily.         Marland Kitchen GABAPENTIN 300 MG PO CAPS   Oral   Take 300 mg by mouth 3 (three) times daily.          Marland Kitchen HYDRALAZINE HCL 10 MG PO TABS   Oral   Take 10 mg by mouth 3 (three) times daily.         Marland Kitchen HYDROCHLOROTHIAZIDE 25 MG PO TABS   Oral   Take 25 mg by mouth daily.         Marland Kitchen METOPROLOL SUCCINATE ER 50 MG PO TB24   Oral   Take 50 mg by mouth daily. Take with or immediately following a meal.         . MIRTAZAPINE 15 MG PO TABS   Oral   Take 15 mg by mouth at bedtime.         . ADULT MULTIVITAMIN W/MINERALS CH   Oral   Take 1 tablet by mouth daily.         Marland Kitchen OMEPRAZOLE 20 MG PO CPDR   Oral   Take 20 mg by mouth daily.         Marland Kitchen POTASSIUM CHLORIDE CRYS ER 20 MEQ PO TBCR   Oral   Take 20 mEq by mouth 2 (two) times daily.         Marland Kitchen PRAMIPEXOLE DIHYDROCHLORIDE 0.125 MG PO TABS   Oral   Take 0.125 mg by mouth 3 (three) times daily.         Marland Kitchen VITAMIN D (ERGOCALCIFEROL) 50000 UNITS PO CAPS   Oral   Take 50,000 Units by mouth every 7 (seven) days.          Marland Kitchen HYDROCODONE-ACETAMINOPHEN 5-325 MG PO TABS   Oral   Take 1 tablet by mouth every 8 (eight) hours as needed. Pain   30 tablet   0   . LOSARTAN POTASSIUM 100 MG PO TABS   Oral   Take 1 tablet (100 mg total) by mouth daily.   15 tablet   0     BP 149/62  Pulse 69  Temp 98 F (36.7 C) (Oral)  Resp 16  Ht 5\' 7"  (1.702 m)  Wt 290  lb (131.543 kg)  BMI 45.42 kg/m2  SpO2 99%  LMP 01/14/2012  Physical Exam  Nursing note and vitals reviewed. Constitutional: She is oriented to person, place, and time. She appears well-developed and well-nourished.  HENT:  Head: Normocephalic and atraumatic.  Eyes: EOM are normal. Pupils are equal, round, and reactive to light.  Neck: Normal range of motion. Neck supple.  Cardiovascular: Normal rate, regular rhythm and normal heart sounds.   No murmur heard. Pulmonary/Chest: Effort normal and breath sounds normal. No respiratory distress. She has no wheezes. She has no rales.  Abdominal: Soft. Bowel sounds are normal. She exhibits no distension. There is  no tenderness. There is no rebound and no guarding.  Musculoskeletal: Normal range of motion.  Neurological: She is alert and oriented to person, place, and time. No cranial nerve deficit.  Skin: Skin is warm and dry.  Psychiatric: She has a normal mood and affect. Her speech is normal.    ED Course  Procedures (including critical care time)  Labs Reviewed  BASIC METABOLIC PANEL - Abnormal; Notable for the following:    Potassium 3.1 (*)     Glucose, Bld 136 (*)     All other components within normal limits  URINALYSIS, ROUTINE W REFLEX MICROSCOPIC - Abnormal; Notable for the following:    Bilirubin Urine MODERATE (*)     All other components within normal limits  CBC - Abnormal; Notable for the following:    HCT 35.9 (*)     All other components within normal limits  PRO B NATRIURETIC PEPTIDE - Abnormal; Notable for the following:    Pro B Natriuretic peptide (BNP) 269.7 (*)     All other components within normal limits  CBC WITH DIFFERENTIAL  D-DIMER, QUANTITATIVE  TROPONIN I  PREGNANCY, URINE  CREATININE, SERUM  TROPONIN I  TROPONIN I  CK TOTAL AND CKMB  TROPONIN I  BASIC METABOLIC PANEL  LAB REPORT - SCANNED   No results found.   1. Chest pain   2. GERD (gastroesophageal reflux disease)   3. Hypertension   4. Diastolic dysfunction      Date: 01/19/2012  Rate: 53  Rhythm: sinus bradycardia  QRS Axis: normal  Intervals: normal  ST/T Wave abnormalities: normal  Conduction Disutrbances:none  Narrative Interpretation:   Old EKG Reviewed: unchanged    MDM  Patient with sharp left-sided chest pain. It is somewhat reproducible. EKG is reassuring except for bradycardia. She continues to have pain after treatment. She has an unknown cardiac history. She is a smoker and is obese. She'll be admitted to medicine for further evaluation        Juliet Rude. Rubin Payor, MD 01/21/12 2321

## 2012-01-19 NOTE — ED Notes (Signed)
Pt. Came in per POV with complaint of chest pain @ 10/10  that started an hour ago prior to coming in, claimed Nauseous but denied vomiting. Also reported SOB. Pt. Had history of chest pain 3 months ago as claimed.

## 2012-01-19 NOTE — Progress Notes (Signed)
[facility] progress note. Chief complaint. Chest pain. History of present illness. This 47 year old female came to the hospital and was admitted with complaints of left-sided chest pain radiating to the back. There is also some mild shortness of breath. He was nausea but no vomiting. Patient was admitted with chest pain rule out acute coronary syndrome. Patient hadn't at 3 sets of troponin and all these negative so far. Patient began complaining of central chest pain and I requested a stat 12-lead EKG. EKG indicates sinus bradycardia without suggestion of ischemia. I also ordered to 1 mg of morphine for the patient's pain. I came to see the patient at the bedside he states there was a little effect with the morphine. She does indicate she has an extensive history of GERD and states that this may be the problem. She usually takes Prilosec for this when needed. Patient describes the pain as sharp and localized under the left breast to the epigastric area. There is radiation of the pain to the back to the left shoulder left jaw. She states she had a hot flash with some diaphoresis. Denies nausea or vomiting. Vital signs. Temperature 90.8, pulse 55, respiration 18, blood pressure 153/85. O2 sats 100%. General appearance. Obese middle-aged female is alert, cooperative, and in no distress. Cardiac. Rate and rhythm regular/mildly bradycardic with apical rate about 88 per minute. Lungs. Diminished midlung to base bilaterally. Patient appears stable on CPAP. Abdomen. Soft and obese with positive bowel sounds. No pain. Impression/plan. Problem #1. Chest pain. Given the normal troponin's and unremarkable EKG's a day for any acute coronary ischemia. The patient history this may well be indigestion/GERD. I will via the patient with a dose of Maalox to see if this might help her discomfort. Will obtain 1 further set of troponin later this a.m. We'll also repeat a 12-lead EKG later this a.m. Patient currently  appears clinically stable.

## 2012-01-20 DIAGNOSIS — I5189 Other ill-defined heart diseases: Secondary | ICD-10-CM

## 2012-01-20 DIAGNOSIS — I517 Cardiomegaly: Secondary | ICD-10-CM

## 2012-01-20 LAB — BASIC METABOLIC PANEL
CO2: 29 mEq/L (ref 19–32)
Chloride: 100 mEq/L (ref 96–112)
Creatinine, Ser: 0.74 mg/dL (ref 0.50–1.10)
GFR calc Af Amer: >90 mL/min (ref >90)
Potassium: 3.5 mEq/L (ref 3.5–5.1)

## 2012-01-20 LAB — TROPONIN I: Troponin I: <0.3 ng/mL (ref <0.30)

## 2012-01-20 MED ORDER — LOSARTAN POTASSIUM 100 MG PO TABS: 100.0000 mg | ORAL_TABLET | Freq: Every day | ORAL | Status: AC

## 2012-01-20 MED ORDER — HYDROCODONE-ACETAMINOPHEN 5-325 MG PO TABS: 1.0000 | ORAL_TABLET | Freq: Three times a day (TID) | ORAL | Status: DC | PRN

## 2012-01-20 MED ORDER — HYDROCODONE-ACETAMINOPHEN 5-325 MG PO TABS
1.0000 | ORAL_TABLET | ORAL | Status: DC | PRN
  Administered 2012-01-20: 2 via ORAL

## 2012-01-20 MED ORDER — PANTOPRAZOLE SODIUM 40 MG PO TBEC: 40.0000 mg | DELAYED_RELEASE_TABLET | Freq: Every day | ORAL | Status: DC

## 2012-01-20 MED ORDER — HYDROCHLOROTHIAZIDE 25 MG PO TABS
25.0000 mg | ORAL_TABLET | Freq: Every day | ORAL | Status: DC
  Administered 2012-01-20: 25 mg via ORAL
  Filled 2012-01-20: qty 1

## 2012-01-20 NOTE — Progress Notes (Signed)
  Echocardiogram 2D Echocardiogram has been performed.  Courtney Rhodes 01/20/2012, 9:06 AM

## 2012-01-20 NOTE — Discharge Summary (Signed)
Physician Discharge Summary  EZMA REHM ZOX:[id number] DOB: August 13, 1965 DOA: 01/19/2012  PCP: No primary provider on file.  Admit date: 01/19/2012 Discharge date: 01/20/2012  Time spent: 50  minutes  Recommendations for Outpatient Follow-up:  1. Follow up with PCP in one week  Discharge Diagnoses:  Active Problems:  Chest pain  Hypertension  GERD (gastroesophageal reflux disease)   Discharge Condition: Stable  Diet recommendation: Low salt diet  Filed Weights   01/19/12 0625  Weight: 131.543 kg (290 lb)    History of present illness:  47 year old female with a history of hypertension who came to the hospital after patient woke up with left-sided chest pain which was radiating to her back, patient also complained of excessive urination within one hour. Patient experienced some mild shortness of breath when she came to the hospital. She admits to having nausea but no vomiting. She denies fever. At this time the pain has improved. Her facet of cardiac enzymes are negative EKG does not show significant abnormality.   Hospital Course:   Chest pain Patient was admitted for observation, cardiac enzymes were cycled which were negative. The EKG did not show any significant abnormality. Patient had elevated BNP so 2-D echo was done which showed grade 2 diastolic dysfunction with EF of 65-70%. Patient will continue to take hydrochlorothiazide 25 mg by mouth daily.  Hypertension Blood pressure is stable, she will continue take her home regimen  Migraines Continue fiorcet, Vicodin  Hypokalemia Replace potassium before discharge  Procedures:  2-D echo which showed EF 65-70% and grade 2 diastolic dysfunction he he  Consultations:  None  Discharge Exam: Filed Vitals:   01/20/12 0608 01/20/12 0926 01/20/12 1135 01/20/12 1319  BP: 153/87 172/92 149/77 149/62  Pulse: 58 67 62 69  Temp: 97.1 F (36.2 C)   98 F (36.7 C)  TempSrc: Oral   Oral  Resp: 18   16  Height:        Weight:      SpO2: 96%  97% 99%    General: In no acute distress Cardiovascular: S1-S2 regular Respiratory: Clear bilaterally Extremities: No edema  Discharge Instructions  Discharge Orders    Future Orders Please Complete By Expires   Diet - low sodium heart healthy      Increase activity slowly          Medication List     As of 01/20/2012  1:44 PM    STOP taking these medications         HYDROcodone-acetaminophen 5-500 MG per tablet   Commonly known as: VICODIN      TAKE these medications         amLODipine 10 MG tablet   Commonly known as: NORVASC   Take 10 mg by mouth daily.      aspirin EC 81 MG tablet   Take 81 mg by mouth daily.      butalbital-acetaminophen-caffeine 50-325-40 MG per tablet   Commonly known as: FIORICET, ESGIC   Take 1 tablet by mouth as directed. 15 tablets must last 30 days      butorphanol 10 MG/ML nasal spray   Commonly known as: STADOL   Place 1 spray into the nose every 4 (four) hours as needed. pain      cloNIDine 0.3 MG tablet   Commonly known as: CATAPRES   Take 0.3 mg by mouth 2 (two) times daily.      cyclobenzaprine 10 MG tablet   Commonly known as: FLEXERIL   Take  10 mg by mouth at bedtime.      diazepam 5 MG tablet   Commonly known as: VALIUM   Take 5-10 mg by mouth 2 (two) times daily. Takes 1 tablet in the morning and 2 at bedtime      etodolac 500 MG tablet   Commonly known as: LODINE   Take 500 mg by mouth 2 (two) times daily.      ferrous fumarate 325 (106 FE) MG Tabs   Commonly known as: HEMOCYTE - 106 mg FE   Take 1 tablet by mouth.      FLUoxetine 40 MG capsule   Commonly known as: PROZAC   Take 40 mg by mouth 2 (two) times daily.      fluticasone 50 MCG/ACT nasal spray   Commonly known as: FLONASE   Place 2 sprays into the nose 2 (two) times daily.      folic acid 1 MG tablet   Commonly known as: FOLVITE   Take 1 mg by mouth daily.      gabapentin 300 MG capsule   Commonly known as: NEURONTIN    Take 300 mg by mouth 3 (three) times daily.      hydrALAZINE 10 MG tablet   Commonly known as: APRESOLINE   Take 10 mg by mouth 3 (three) times daily.      hydrochlorothiazide 25 MG tablet   Commonly known as: HYDRODIURIL   Take 25 mg by mouth daily.      HYDROcodone-acetaminophen 5-325 MG per tablet   Commonly known as: NORCO/VICODIN   Take 1 tablet by mouth every 8 (eight) hours as needed. Pain      losartan 100 MG tablet   Commonly known as: COZAAR   Take 1 tablet (100 mg total) by mouth daily.      metoprolol succinate 50 MG 24 hr tablet   Commonly known as: TOPROL-XL   Take 50 mg by mouth daily. Take with or immediately following a meal.      mirtazapine 15 MG tablet   Commonly known as: REMERON   Take 15 mg by mouth at bedtime.      multivitamin with minerals Tabs   Take 1 tablet by mouth daily.      omeprazole 20 MG capsule   Commonly known as: PRILOSEC   Take 20 mg by mouth daily.      potassium chloride SA 20 MEQ tablet   Commonly known as: K-DUR,KLOR-CON   Take 20 mEq by mouth 2 (two) times daily.      pramipexole 0.125 MG tablet   Commonly known as: MIRAPEX   Take 0.125 mg by mouth 3 (three) times daily.      VITAMIN B 12 PO   Take 1 mL by mouth daily.      Vitamin D (Ergocalciferol) 50000 UNITS Caps   Commonly known as: DRISDOL   Take 50,000 Units by mouth every 7 (seven) days.          The results of significant diagnostics from this hospitalization (including imaging, microbiology, ancillary and laboratory) are listed below for reference.    Significant Diagnostic Studies: Dg Chest 2 View  01/19/2012  *RADIOLOGY REPORT*  Clinical Data: Left chest pain.  Shortness of breath.  CHEST - 2 VIEW  Comparison: 06/10/2009  Findings: Mild cardiomegaly is stable.  Both lungs are clear.  No evidence of pleural effusion.  No mass or lymphadenopathy identified.  IMPRESSION: Stable mild cardiomegaly.  No active lung disease.   Original  Report Authenticated  By: Myles Rosenthal, M.D.     Microbiology: No results found for this or any previous visit (from the past 240 hour(s)).   Labs: Basic Metabolic Panel:  Lab 01/19/12 4540 01/19/12 0605  NA -- 136  K -- 3.1*  CL -- 99  CO2 -- 28  GLUCOSE -- 136*  BUN -- 9  CREATININE 0.70 0.72  CALCIUM -- 9.3  MG -- --  PHOS -- --   Liver Function Tests: No results found for this basename: AST:5,ALT:5,ALKPHOS:5,BILITOT:5,PROT:5,ALBUMIN:5 in the last 168 hours No results found for this basename: LIPASE:5,AMYLASE:5 in the last 168 hours No results found for this basename: AMMONIA:5 in the last 168 hours CBC:  Lab 01/19/12 1203 01/19/12 0605  WBC 9.1 7.9  NEUTROABS -- 4.7  HGB 12.1 12.5  HCT 35.9* 37.2  MCV 87.1 87.3  PLT 376 357   Cardiac Enzymes:  Lab 01/20/12 0501 01/19/12 1719 01/19/12 1203 01/19/12 0605  CKTOTAL -- -- 73 --  CKMB -- -- 1.5 --  CKMBINDEX -- -- -- --  TROPONINI <0.30 <0.30 <0.30 <0.30   BNP: BNP (last 3 results)  Basename 01/19/12 1203  PROBNP 269.7*   CBG: No results found for this basename: GLUCAP:5 in the last 168 hours     Signed:  LAMA,GAGAN S  [facility] 01/20/2012, 1:44 PM

## 2012-01-20 NOTE — Progress Notes (Signed)
Pharmacy: IV to PO Protonix  Patient has been receiving IV Protonix. Per Pharmacy and Therapeutics Committee, this patient meets criteria for auto conversion to PO Protonix:   Tolerating a diet of full liquids or better  Tolerating other medications via enteral route  No GIB  Courtney Rhodes PharmD  [phone] 02/12/2011 8:54 AM    

## 2012-01-20 NOTE — Progress Notes (Signed)
Discharge instruction and prescriptions reviewed with patient she as no questions at this time. Patient waiting for ride report given to Darl Pikes, [place]

## 2012-02-15 NOTE — ED Notes (Signed)
Triage note: Patient arrives from home for chest pain that began an hour ago while having dinner at a restaurant. Patient is short of breath and nauseated. Patient recently discharged from a hospital in [place] and was admitted for CHF.

## 2012-02-16 LAB — D-DIMER, QUANTITATIVE: D-Dimer, Quant: 0.37 mg/L FEU (ref 0.00–0.65)

## 2012-02-16 LAB — PHOSPHORUS: Phosphorus: 2.8 MG/DL (ref 2.5–4.9)

## 2012-02-16 LAB — POC CHEM8
Anion gap (POC): 13 mmol/L (ref 5–15)
BUN (POC): 9 MG/DL (ref 9–20)
CO2 (POC): 30 MMOL/L (ref 21–32)
Calcium, ionized (POC): 1.13 MMOL/L (ref 1.12–1.32)
Chloride (POC): 101 MMOL/L (ref 98–107)
Creatinine (POC): 0.8 MG/DL (ref 0.6–1.3)
GFRAA, POC: >60 mL/min/{1.73_m2} (ref >60)
GFRNA, POC: >60 mL/min/{1.73_m2} (ref >60)
Glucose (POC): 127 MG/DL — ABNORMAL HIGH (ref 65–105)
Hematocrit (POC): 35 % (ref 35.0–47.0)
Hemoglobin (POC): 11.9 GM/DL (ref 11.5–16.0)
Potassium (POC): 3.2 MMOL/L — ABNORMAL LOW (ref 3.5–5.1)
Sodium (POC): 140 MMOL/L (ref 136–145)

## 2012-02-16 LAB — CBC WITH AUTOMATED DIFF
ABS. BASOPHILS: 0 10*3/uL (ref 0.0–0.1)
ABS. EOSINOPHILS: 0.2 10*3/uL (ref 0.0–0.4)
ABS. LYMPHOCYTES: 2.7 10*3/uL (ref 0.8–3.5)
ABS. MONOCYTES: 0.6 10*3/uL (ref 0.0–1.0)
ABS. NEUTROPHILS: 5.5 10*3/uL (ref 1.8–8.0)
BASOPHILS: 0 % (ref 0–1)
EOSINOPHILS: 3 % (ref 0–7)
HCT: 37.2 % (ref 35.0–47.0)
HGB: 12 g/dL (ref 11.5–16.0)
LYMPHOCYTES: 30 % (ref 12–49)
MCH: 28.8 PG (ref 26.0–34.0)
MCHC: 32.3 g/dL (ref 30.0–36.5)
MCV: 89.4 FL (ref 80.0–99.0)
MONOCYTES: 6 % (ref 5–13)
NEUTROPHILS: 61 % (ref 32–75)
PLATELET: 337 10*3/uL (ref 150–400)
RBC: 4.16 M/uL (ref 3.80–5.20)
RDW: 14.4 % (ref 11.5–14.5)
WBC: 9 10*3/uL (ref 3.6–11.0)

## 2012-02-16 LAB — MAGNESIUM: Magnesium: 1.6 MG/DL (ref 1.6–2.4)

## 2012-02-16 LAB — METABOLIC PANEL, COMPREHENSIVE
A-G Ratio: 0.8 — ABNORMAL LOW (ref 1.1–2.2)
ALT (SGPT): 52 U/L (ref 12–78)
AST (SGOT): 25 U/L (ref 15–37)
Albumin: 3.9 g/dL (ref 3.5–5.0)
Alk. phosphatase: 73 U/L (ref 50–136)
Anion gap: 6 mmol/L (ref 5–15)
BUN/Creatinine ratio: 14 (ref 12–20)
BUN: 11 MG/DL (ref 6–20)
Bilirubin, total: 0.3 MG/DL (ref 0.2–1.0)
CO2: 29 MMOL/L (ref 21–32)
Calcium: 8.8 MG/DL (ref 8.5–10.1)
Chloride: 102 MMOL/L (ref 97–108)
Creatinine: 0.81 MG/DL (ref 0.45–1.15)
GFR est AA: >60 mL/min/{1.73_m2} (ref >60)
GFR est non-AA: >60 mL/min/{1.73_m2} (ref >60)
Globulin: 4.6 g/dL — ABNORMAL HIGH (ref 2.0–4.0)
Glucose: 122 MG/DL — ABNORMAL HIGH (ref 65–100)
Potassium: 3 MMOL/L — ABNORMAL LOW (ref 3.5–5.1)
Protein, total: 8.5 g/dL — ABNORMAL HIGH (ref 6.4–8.2)
Sodium: 137 MMOL/L (ref 136–145)

## 2012-02-16 LAB — D DIMER: D-dimer: 0.37 mg/L FEU (ref 0.00–0.65)

## 2012-02-16 LAB — PROTHROMBIN TIME + INR
INR: 1.1 (ref 0.9–1.1)
Prothrombin time: 11.5 s (ref 9.4–11.7)

## 2012-02-16 LAB — BNP: BNP: 42 pg/mL (ref 0–100)

## 2012-02-16 LAB — LIPASE: Lipase: 195 U/L (ref 73–393)

## 2012-02-16 LAB — PTT: aPTT: 30.5 s — ABNORMAL HIGH (ref 23.0–30.0)

## 2012-02-16 LAB — TROPONIN I
Troponin-I, Qt.: <0.04 ng/mL (ref <0.05)
Troponin-I, Qt.: <0.04 ng/mL (ref <0.05)

## 2012-02-16 LAB — CK W/ REFLX CKMB: CK: 86 U/L (ref 26–192)

## 2012-02-16 MED ADMIN — nitroglycerin (NITROSTAT) tablet 0.4 mg: SUBLINGUAL | @ 05:00:00 | NDC 00071041813

## 2012-02-16 MED ADMIN — losartan (COZAAR) tablet 100 mg: ORAL | @ 15:00:00 | NDC 68084034711

## 2012-02-16 MED ADMIN — sodium chloride (NS) flush 5-10 mL: INTRAVENOUS | @ 17:00:00 | NDC 87701099893

## 2012-02-16 MED ADMIN — FLUoxetine (PROZAC) capsule 40 mg: ORAL | @ 15:00:00 | NDC 68084060511

## 2012-02-16 MED ADMIN — heparin (porcine) injection 5,000 Units: SUBCUTANEOUS | @ 17:00:00 | NDC 25021040201

## 2012-02-16 MED ADMIN — sodium chloride (NS) flush 10 mL: INTRAVENOUS | @ 07:00:00 | NDC 87701099893

## 2012-02-16 MED ADMIN — morphine injection 4 mg: INTRAVENOUS | @ 06:00:00 | NDC 00409189101

## 2012-02-16 MED ADMIN — nitroglycerin (NITROSTAT) tablet 0.4 mg: SUBLINGUAL | @ 06:00:00 | NDC 00071041813

## 2012-02-16 MED ADMIN — famotidine (PEPCID) tablet 40 mg: ORAL | @ 15:00:00 | NDC 68084017211

## 2012-02-16 MED ADMIN — nitroglycerin (NITROBID) 2 % ointment 1 Inch: TOPICAL | @ 08:00:00 | NDC 00281032608

## 2012-02-16 MED ADMIN — amLODIPine (NORVASC) tablet 10 mg: ORAL | @ 15:00:00 | NDC 50268008115

## 2012-02-16 MED ADMIN — hydrochlorothiazide (HYDRODIURIL) tablet 25 mg: ORAL | @ 15:00:00 | NDC 68084008611

## 2012-02-16 MED ADMIN — aspirin 81 mg chewable tablet: ORAL | @ 06:00:00 | NDC 63739043401

## 2012-02-16 MED ADMIN — heparin (porcine) injection 5,000 Units: SUBCUTANEOUS | @ 09:00:00 | NDC 25021040201

## 2012-02-16 MED ADMIN — aspirin delayed-release tablet 81 mg: ORAL | @ 15:00:00 | NDC 00603002622

## 2012-02-16 MED ADMIN — sodium chloride 0.9 % bolus infusion 100 mL: INTRAVENOUS | @ 07:00:00 | NDC 00409798437

## 2012-02-16 MED ADMIN — potassium chloride SR (KLOR-CON 10) tablet 40 mEq: ORAL | @ 09:00:00 | NDC 00245004189

## 2012-02-16 MED ADMIN — sodium chloride (NS) flush 5-10 mL: INTRAVENOUS | @ 11:00:00 | NDC 87701099893

## 2012-02-16 MED ADMIN — sodium chloride (NS) flush 5-10 mL: INTRAVENOUS | @ 19:00:00 | NDC 87701099893

## 2012-02-16 MED ADMIN — ondansetron (ZOFRAN) injection 8 mg: INTRAVENOUS | @ 05:00:00 | NDC 00409475503

## 2012-02-16 MED ADMIN — gabapentin (NEURONTIN) capsule 300 mg: ORAL | @ 15:00:00 | NDC 68084056311

## 2012-02-16 MED ADMIN — HYDROmorphone (PF) (DILAUDID) injection 1 mg: INTRAVENOUS | @ 08:00:00 | NDC 00409255201

## 2012-02-16 MED ADMIN — cloNIDine (CATAPRESS) tablet 0.2 mg: ORAL | @ 15:00:00 | NDC 62584033911

## 2012-02-16 MED ADMIN — potassium chloride SR (KLOR-CON 10) tablet 20 mEq: ORAL | @ 15:00:00 | NDC 00245004189

## 2012-02-16 MED ADMIN — ioversol (OPTIRAY) 350 mg iodine/mL contrast solution 100 mL: INTRAVENOUS | @ 07:00:00 | NDC 00019133311

## 2012-02-16 MED FILL — BD POSIFLUSH NORMAL SALINE 0.9 % INJECTION SYRINGE: INTRAMUSCULAR | Qty: 10

## 2012-02-16 MED FILL — SODIUM CHLORIDE 0.9 % IV: INTRAVENOUS | Qty: 100

## 2012-02-16 MED FILL — HEPARIN (PORCINE) 5,000 UNIT/ML IJ SOLN: 5000 unit/mL | INTRAMUSCULAR | Qty: 1

## 2012-02-16 MED FILL — NITRO-BID 2 % TRANSDERMAL OINTMENT: 2 % | TRANSDERMAL | Qty: 1

## 2012-02-16 MED FILL — OPTIRAY 350 MG IODINE/ML INTRAVENOUS SOLUTION: 350 mg iodine/mL | INTRAVENOUS | Qty: 100

## 2012-02-16 MED FILL — BAYER LOW DOSE ASPIRIN 81 MG TABLET,DELAYED RELEASE: 81 mg | ORAL | Qty: 1

## 2012-02-16 MED FILL — GABAPENTIN 300 MG CAP: 300 mg | ORAL | Qty: 1

## 2012-02-16 MED FILL — FLUOXETINE 20 MG CAP: 20 mg | ORAL | Qty: 2

## 2012-02-16 MED FILL — MORPHINE 4 MG/ML SYRINGE: 4 mg/mL | INTRAMUSCULAR | Qty: 1

## 2012-02-16 MED FILL — MONOJECT PREFILL ADVANCED 0.9 % SODIUM CHLORIDE INJECTION SYRINGE: INTRAMUSCULAR | Qty: 10

## 2012-02-16 MED FILL — BAYER CHILDRENS ASPIRIN 81 MG CHEWABLE TABLET: 81 mg | ORAL | Qty: 2

## 2012-02-16 MED FILL — CLONIDINE 0.2 MG TAB: 0.2 mg | ORAL | Qty: 1

## 2012-02-16 MED FILL — K-TAB 10 MEQ TABLET,EXTENDED RELEASE: 10 mEq | ORAL | Qty: 2

## 2012-02-16 MED FILL — ONDANSETRON (PF) 4 MG/2 ML INJECTION: 4 mg/2 mL | INTRAMUSCULAR | Qty: 4

## 2012-02-16 MED FILL — COZAAR 50 MG TABLET: 50 mg | ORAL | Qty: 2

## 2012-02-16 MED FILL — NITROGLYCERIN 0.4 MG SUBLINGUAL TAB: 0.4 mg | SUBLINGUAL | Qty: 1

## 2012-02-16 MED FILL — HYDROCHLOROTHIAZIDE 25 MG TAB: 25 mg | ORAL | Qty: 1

## 2012-02-16 MED FILL — K-TAB 10 MEQ TABLET,EXTENDED RELEASE: 10 mEq | ORAL | Qty: 4

## 2012-02-16 MED FILL — AMLODIPINE 5 MG TAB: 5 mg | ORAL | Qty: 2

## 2012-02-16 MED FILL — FAMOTIDINE 20 MG TAB: 20 mg | ORAL | Qty: 2

## 2012-02-16 MED FILL — HYDROMORPHONE (PF) 1 MG/ML IJ SOLN: 1 mg/mL | INTRAMUSCULAR | Qty: 1

## 2012-02-16 NOTE — ED Notes (Signed)
Patient states that despite medications, pain has no subsided.

## 2012-02-16 NOTE — Consults (Signed)
Cardiology Consultation Note     Subjective:      Erica Oconnor is a 47 yo woman who is from [place], [place] who drove here for conference and had chest pain while at rest last night  She said it was sharp, low sternum area and radiated to the back.  It was worse with deep breaths.  She had first troponin negative and ECG without ST depression or elevation  She had chest and abdominal CT without aortic dissection or PE as reported by radiologist  She said she had stress test and echo 2 weeks ago because of the same chest pain but these were done in [place] and I do not have these records in chart right now  She has hypertension and her father had many MI  She has never smoked  She snores      Patient Active Problem List   Diagnosis Code   ??? Chest pain, unspecified 786.50     Current Facility-Administered Medications   Medication Dose Route Frequency Provider Last Rate Last Dose   ??? [COMPLETED] morphine injection 4 mg  4 mg IntraVENous ONCE Kathrene Alu, MD   4 mg at 02/16/12 0034   ??? [COMPLETED] ondansetron (ZOFRAN) injection 8 mg  8 mg IntraVENous NOW Kathrene Alu, MD   8 mg at 02/16/12 0028   ??? [COMPLETED] aspirin chewable tablet 162 mg  162 mg Oral NOW Kathrene Alu, MD   162 mg at 02/16/12 0035   ??? [COMPLETED] ioversol (OPTIRAY) 350 mg iodine/mL contrast solution 100 mL  100 mL IntraVENous RAD ONCE Jonelle Sports, MD   100 mL at 02/16/12 0140   ??? [COMPLETED] sodium chloride (NS) flush 10 mL  10 mL IntraVENous RAD ONCE Jonelle Sports, MD   10 mL at 02/16/12 0140   ??? [COMPLETED] sodium chloride 0.9 % bolus infusion 100 mL  100 mL IntraVENous RAD ONCE Jonelle Sports, MD   100 mL at 02/16/12 0140   ??? [COMPLETED] nitroglycerin (NITROBID) 2 % ointment 1 Inch  1 Inch Topical NOW Kathrene Alu, MD   1 Inch at 02/16/12 0254   ??? [COMPLETED] HYDROmorphone (PF) (DILAUDID) injection 1 mg  1 mg IntraVENous NOW Kathrene Alu, MD   1 mg at 02/16/12 0254   ??? sodium chloride (NS) flush 5-10 mL  5-10 mL IntraVENous  Q8H Vidya Nagamangala, MD       ??? sodium chloride (NS) flush 5-10 mL  5-10 mL IntraVENous PRN Daune Perch, MD       ??? acetaminophen (TYLENOL) tablet 650 mg  650 mg Oral Q6H PRN Daune Perch, MD       ??? morphine injection 1 mg  1 mg IntraVENous Q4H PRN Daune Perch, MD       ??? heparin (porcine) injection 5,000 Units  5,000 Units SubCUTAneous Q8H Vidya Nagamangala, MD   5,000 Units at 02/16/12 0406   ??? [COMPLETED] potassium chloride SR (KLOR-CON 10) tablet 40 mEq  40 mEq Oral NOW Vidya Nagamangala, MD   40 mEq at 02/16/12 0406   ??? [COMPLETED] nitroglycerin (NITROSTAT) tablet 0.4 mg  0.4 mg SubLINGual Q5MIN PRN Kathrene Alu, MD   0.4 mg at 02/16/12 0057   ??? [DISCONTINUED] aspirin (ASPIRIN) tablet 325 mg  325 mg Oral NOW Kathrene Alu, MD         Allergies   Allergen Reactions   ??? Ace Inhibitors Hives and Swelling     Past Medical History  Diagnosis Date   ??? Diastolic CHF    ??? Hypertension    ??? Depression      History reviewed. No pertinent past surgical history.  History reviewed. + CAD/MI in family history.  History   Substance Use Topics   ??? Smoking status: Never Smoker    ??? Smokeless tobacco: Not on file   ??? Alcohol Use: No        Review of Systems:   Constitutional: Negative for fever, chills, weight loss, + malaise/fatigue (could not sleep last night).   HEENT: Negative for nosebleeds, vision changes.   Respiratory: Negative for cough, hemoptysis, sputum production, and wheezing.   Cardiovascular: Negative for palpitations, orthopnea, claudication, leg swelling, syncope, and PND.   Gastrointestinal: Negative for nausea, vomiting, diarrhea, constipation, blood in stool and melena.   Genitourinary: Negative for dysuria, and hematuria.   Musculoskeletal: Negative for myalgias, arthralgia.   Skin: Negative for rash.   Heme: Does not bleed or bruise easily.   Neurological: Negative for speech change and focal weakness     Objective:     BP 136/84   Pulse 59   Temp(Src) 97.9 ??F (36.6 ??C)   Resp 16    Ht 5\' 7"  (1.702 m)   Wt 296 lb 11.8 oz (134.6 kg)   BMI 46.47 kg/m2   SpO2 96%   Physical Exam:   Constitutional: well-developed and well-nourished. No distress.   Head: Normocephalic and atraumatic.   Eyes: Pupils are equal, round  Neck: supple. No JVD present.   Cardiovascular: Normal rate, regular rhythm and normal heart sounds.  Exam reveals no gallop and no friction rub.  No murmur heard.  Pulmonary/Chest: Effort normal and breath sounds normal. No wheezes.   Abdominal: morbidly obese  Musculoskeletal: no edema.   Neurological: alert,oriented.   Skin: Skin is warm and dry  Psychiatric: normal mood and affect. Behavior is normal. Judgment and thought content normal.      EKG: as in HPI.       Assessment/Plan:   Chest pain  Hypertension  LVH per echo reported from ER in September 2013 ? ER attending got from [place]?  At this time she is pain free and there is no marker evidence of MI nor ECG change to suggest ischemia or infarction and she still needs 2 more sets of cardiac enzymes pending  She has no PE or aortic dissection per CT report  If the stress test from [facility] [place] can be obtained and shows normal perfusion, I think she can go back home but otherwise she needs to have a stress test here  She declines this option of waiting to have stress test in [place]    Thank you for involving me in this patient's care and please call with further concerns or questions.      Juliet Rude, M.D.  Electrophysiology/Cardiology  [facility]  [address]                      [address] [place], [place] [address]                             [place] [address]  [phone]  [phone]

## 2012-02-16 NOTE — ED Notes (Signed)
TRANSFER - OUT REPORT:    Verbal report given to Lawson Fiscal on Erica Oconnor  being transferred to PTU for routine progression of care       Report consisted of patient???s Situation, Background, Assessment and   Recommendations(SBAR).     Information from the following report(s) SBAR, ED Summary, [facility] and Recent Results was reviewed with the receiving nurse.    Opportunity for questions and clarification was provided.

## 2012-02-16 NOTE — ED Provider Notes (Addendum)
HPI Comments: Erica Oconnor is a 47 y.o.female with pmhx of diastolic CHF and HTN, who presents to the ED with complaint of mid-sternal CP/Epigastric discomfort radiating to her back, onset approximately 1 hour after eating dinner tonight. Pt reports eating broccoli soup, salad, and drinking water. She reports severity of 10/10 and describes this as tightness/heaviness. Taking deep breaths exacerbates her pain. She took aspirin 81mg  x 2 PTA. Pt also complains of nausea but denies vomiting. No diarrhea, constipation, leg pain or swelling, fever, cough, or congestion. Pt had similar pain approximately 2 weeks ago- seen at [facility] in [place], [place], admitted 2-3 days and dx with diastolic heart failure. Pt instructed to follow-up with her cardiologist but she has not done this yet. Pt reports hx of GERD and states that current sx are not similar to this. She denies hx of gallbladder problems. No hx of abdominal or chest surgeries. Pt has no other acute medical complaints at this time.    Echocardiogram (09/18/11):   1. Overall left ventricular EF is 60-65%  2. Normal global left ventricular systolic function  3. Moderate concentric LVH  4. Pseudonormal pattern of LV diastolic filling  5. Mildly dilated left atrium  6. Mildly elevated pulmonary artery systolic pressure  7. Elevated left ventricular end diastolic pressure by tissue doppler    Social hx: +smoker (cigars), no alcohol or drug use  PCP: Not on file    Note written by Mariella Saa, Scribe, as dictated by Kathrene Alu, MD 12:10 AM    The history is provided by the patient.        Past Medical History   Diagnosis Date   ??? Diastolic CHF    ??? Hypertension         History reviewed. No pertinent past surgical history.      History reviewed. No pertinent family history.     History     Social History   ??? Marital Status: N/A     Spouse Name: N/A     Number of Children: N/A   ??? Years of Education: N/A     Occupational History   ??? Not on file.      Social History Main Topics   ??? Smoking status: Never Smoker    ??? Smokeless tobacco: Not on file   ??? Alcohol Use: No   ??? Drug Use: No   ??? Sexually Active: Not on file     Other Topics Concern   ??? Not on file     Social History Narrative   ??? No narrative on file                  ALLERGIES: Ace inhibitors      Review of Systems   All other systems reviewed and are negative.        Filed Vitals:    02/15/12 2354   Height: 5\' 7"  (1.702 m)   Weight: 127.007 kg (280 lb)            Physical Exam     CONSTITUTIONAL: Well-appearing; well-nourished; in no apparent distress  HEAD: Normocephalic; atraumatic  EYES: PERRL; EOM intact; conjunctiva and sclera are clear bilaterally.  ENT: No rhinorrhea; normal pharynx with no tonsillar hypertrophy; mucous membranes pink/moist, no erythema, no exudate.  NECK: Supple; non-tender; no cervical lymphadenopathy  CARD: Normal S1, S2; no murmurs, rubs, or gallops. Regular rate and rhythm.  RESP: Normal respiratory effort; breath sounds clear and equal bilaterally;  no wheezes, rhonchi, or rales.  ABD: Normal bowel sounds; non-distended; non-tender; no palpable organomegaly, no masses, no bruits.  Back Exam: Normal inspection; no vertebral point tenderness, no CVA tenderness. Normal range of motion.  EXT: Normal ROM in all four extremities; non-tender to palpation; no swelling or deformity; distal pulses are normal, no edema.  SKIN: Warm; dry; no rash.  NEURO:Alert and oriented x 3, coherent, NII-XII grossly intact, sensory and motor are non-focal.      MDM     Differential Diagnosis; Clinical Impression; Plan:     Assessment: 47 year old female with multiple risk factors for ACS who present with chest pain and recent diagnosis of diastolic heart failure by echocardiogram. The patient stated that she did not have a catheterization. Her last stress test was performed approximately one year ago.    Plan: EKG/ Chest-X ray/Labs/IVF/ASA/NTP/ CTA of Chest, Abdomen and pelvis/ Analgesia/ Monitor and  Reevaluate.      Amount and/or Complexity of Data Reviewed:   Clinical lab tests:  Ordered and reviewed  Tests in the radiology section of CPT??:  Ordered and reviewed  Tests in the medicine section of the CPT??:  Reviewed and ordered  Discussion of test results with the performing providers:  Yes   Decide to obtain previous medical records or to obtain history from someone other than the patient:  Yes   Obtain history from someone other than the patient:  Yes   Review and summarize past medical records:  Yes   Discuss the patient with another provider:  Yes   Independant visualization of image, tracing, or specimen:  Yes  Risk of Significant Complications, Morbidity, and/or Mortality:   Presenting problems:  Moderate  Diagnostic procedures:  Moderate  Management options:  Moderate  Critical Care:   Total time providing critical care:  30-74 minutes (Excluding all other billable procedures.)  Progress:   Patient progress:  Stable      Procedures    ED EKG interpretation:  Rhythm: normal sinus rhythm; and regular . Rate (approx.): 61; Axis: normal; P wave: normal; QRS interval: normal ; ST/T wave: non-specific changes; in  Lead: Diffusely; LVH with early repolarization. Other findings: abnormal ekg. No prior EKG available for comparison This EKG was interpreted by Kathrene Alu, MD,ED Provider.    XRAY INTERPRETATION (ED MD)  Chest Xray  No acute process seen. Normal heart size. No bony abnormalities. No infiltrate.  Kathrene Alu, MD 00:25 AM    PROGRESS NOTE:  Pt has been reexamined by Kathrene Alu, MD all available results have been reviewed with pt and any available family. Pt understands sx, dx, and tx in ED. Care plan has been outlined and questions have been answered. Pt and any available family understands and agrees to need for admission to hospital for further tx not available in ED. Pt is ready for admission.    Written by Kathrene Alu, MD,  1:25 AM    CONSULT NOTE:  Kathrene Alu, MD spoke with Dr.  Smitty Cords of the adult hospitalist. Discussed patient's presentation, history, physical assessment, and available diagnostic results. Will come and see the patient in the ED.    Marland Kitchen

## 2012-02-16 NOTE — Progress Notes (Signed)
Pt has refused Pneumonia vaccine at this time. Will f/u with her PCP  In [place].

## 2012-02-16 NOTE — Discharge Summary (Signed)
Discharge Summary       PATIENT ID: Erica Oconnor  MRN: [id number]   DATE OF BIRTH: 01-19-1965    DATE OF ADMISSION: 02/15/2012 11:49 PM    DATE OF DISCHARGE: 02/16/2012   PRIMARY CARE PROVIDER: Lynnae Prude, MD       DISCHARGING PHYSICIAN: Teofilo Pod, MD    To contact this individual call [phone] and ask the operator to page.  If unavailable ask to be transferred the Adult Hospitalist Department.    CONSULTATIONS: IP CONSULT TO HOSPITALIST  IP CONSULT TO CARDIOLOGY    PROCEDURES/SURGERIES: * No surgery found *    ADMITTING DIAGNOSES & HOSPITAL COURSE:   47 yr old female with pmh of HTn, ?diastolic CHF comes over here on 2/1 with chest pain and nausea   1. Chest pain/nausea. Troponins normal. Telemetry unrevealing. Appreciate Cardiology input. Will get the records from [place]. Will discharge when the results are back and if they are normal.  2. HTN controlled.  3. Hypokalemia sec to nausea/vomiting. Replaced prn  4. Anxiety/Depression stable  5. OSA on CPAP. Counseled to use her on sleeping. The patient understands.              DISCHARGE DIAGNOSES / PLAN:      1. Chest pain. Awaiting records form [place]. IF normal, will discharge her as per Cardiology.       PENDING TEST RESULTS:   At the time of discharge the following test results are still pending: none    FOLLOW UP APPOINTMENTS:    Follow-up Information    Follow up With Details Comments Contact Info    Lynnae Prude, MD In 1 week  [address]  [place] [address]  [phone]             ADDITIONAL CARE RECOMMENDATIONS: none    DIET: Cardiac Diet    ACTIVITY: activity as tolerated          DISCHARGE MEDICATIONS:  Current Discharge Medication List      CONTINUE these medications which have NOT CHANGED    Details   FLUoxetine (PROZAC) 40 mg capsule Take 40 mg by mouth two (2) times a day.      potassium chloride (KLOR-CON M20) 20 mEq tablet Take 20 mEq by mouth daily.      pramipexole (MIRAPEX) 0.125 mg tablet Take 0.125 mg by mouth nightly.       gabapentin (NEURONTIN) 300 mg capsule Take 300 mg by mouth two (2) times a day.      aspirin delayed-release 81 mg tablet Take 81 mg by mouth daily.      etodolac (LODINE) 500 mg tablet Take 500 mg by mouth two (2) times a day.      famotidine (PEPCID) 40 mg tablet Take 40 mg by mouth two (2) times a day.      cloNIDine (CATAPRESS) 0.2 mg tablet Take 0.2 mg by mouth two (2) times a day.      diazepam (VALIUM) 5 mg tablet Take 5 mg by mouth every six (6) hours as needed for Anxiety.      butalbital-acetaminophen (PHRENILIN) 50-325 mg tablet Take 1 Tab by mouth every six (6) hours as needed.      amLODIPine (NORVASC) 10 mg tablet Take 10 mg by mouth daily.      ranitidine (ZANTAC) 150 mg tablet Take 150 mg by mouth two (2) times a day.      losartan (COZAAR) 100 mg tablet Take 100 mg by mouth  daily.      hydrochlorothiazide (HYDRODIURIL) 25 mg tablet Take 25 mg by mouth daily.      mirtazapine (REMERON) 15 mg tablet Take 15 mg by mouth nightly.      ferrous sulfate (IRON) 325 mg (65 mg iron) tablet Take  by mouth Daily (before breakfast).         STOP taking these medications       meloxicam (MOBIC) 15 mg tablet Comments:   Reason for Stopping:         ibuprofen (MOTRIN) 800 mg tablet Comments:   Reason for Stopping:                 NOTIFY YOUR PHYSICIAN FOR ANY OF THE FOLLOWING:   Fever over 101 degrees for 24 hours.   Chest pain, shortness of breath, fever, chills, nausea, vomiting, diarrhea, change in mentation, falling, weakness, bleeding. Severe pain or pain not relieved by medications.  Or, any other signs or symptoms that you may have questions about.    DISPOSITION:  xxx  Home With:   OT  PT  HH  RN       Long term SNF/Inpatient Rehab    Independent/assisted living    Hospice    Other:       PATIENT CONDITION AT DISCHARGE:     Functional status    Poor     Deconditioned    x Independent      Cognition    x Lucid     Forgetful     Dementia      Catheters/lines (plus indication)    Foley     PICC     PEG     x None      Code status    x Full code     DNR      PHYSICAL EXAMINATION AT DISCHARGE:         CHRONIC MEDICAL DIAGNOSES:  Problem List as of 09-Mar-2012 Never Reviewed        Codes Class Noted - Resolved    *Chest pain, unspecified 786.50  2012/03/09 - Present              Greater than 37 minutes were spent with the patient on counseling and coordination of care    Signed:   Teofilo Pod, MD  03/09/2012  12:12 PM

## 2012-02-16 NOTE — ED Notes (Signed)
Pt gave written and verbal permission for [place] to obtain her record from outside hospital.

## 2012-02-16 NOTE — ED Notes (Signed)
Pt gave permission to this RN to access her test results on [facility]'s My Chart. Echo results printed for Dr. Mindi Slicker at his request from 09/2011. Female companion remains at the bedside.

## 2012-02-16 NOTE — Progress Notes (Signed)
Hospitalist Progress Note   Erica Pod, MD   After 7pm call hospitalist on call through the operator      02/16/2012   PCP:  Dr. Lynnae Prude, MD    Assessment/Plan   47 yr old female with pmh of HTn, ?diastolic CHF comes over here on 2/1 with chest pain and nausea  1. Chest pain/nausea. Troponins normal. Telemetry unrevealing. APpreciate Cardiology input. Will get the records from [place]. Will discharge when the results are back and if they are normal.  2. HTN controlled.  3. Hypokalemia sec to nausea/vomiting. Replaced prn  4. Anxiety/Depression stable    See orders for other plans.  VTE prophylaxis: Heparin  Discussed plan of care with Patient/Family   Discharge planning: As above once the results of the stress test and 2 d echo are back and normal     Subjective   No new issues   Reviewed interval history    Physical examination     BP 123/79   Pulse 60   Temp(Src) 97.6 ??F (36.4 ??C)   Resp 18   Ht 5\' 7"  (1.702 m)   Wt 134.6 kg (296 lb 11.8 oz)   BMI 46.47 kg/m2   SpO2 96%    Temp (24hrs), Avg:97.8 ??F (36.6 ??C), Min:97.6 ??F (36.4 ??C), Max:97.9 ??F (36.6 ??C)      Intake/Output Summary (Last 24 hours) at 02/16/12 1051  Last data filed at 02/16/12 0945   Gross per 24 hour   Intake    240 ml   Output      0 ml   Net    240 ml       General:   Alert, cooperative, no acute distress   Head:   Atraumatic   Eyes:   Conjunctivae clear   ENMT:  Oral mucosa normal   Neck:  Supple, trachea midline, no adenopathy   No JVD   Back:    No CVA tenderness    Chest wall:    No tenderness or deformities    Lungs:   Clear to auscultation bilaterally    Heart:   Regular rhythm, no murmur   Abdomen:    Soft, non-tender   No masses or organomegaly   Extremities:  No edema or DVT signs   Pulses:  Symmetric all extremities   Skin:  Warm and dry    No rashes or lesions   Neurologic:  Oriented   No focal deficits   Lymphatic:  No palpable lymphadenoapthy       Urinary catheter:     Central line:     Other tubes/lines:       Data  Review   Reviewed   Telemetry   I have reviewed the flow sheet and recent notes  New labs and data below personally reviewed.    Recent Labs      02/15/12   2359   WBC  9.0   HGB  12.0   HCT  37.2   PLT  337     Recent Labs      02/15/12   2359   NA  137   K  3.0*   CL  102   CO2  29   GLU  122*   BUN  11   CREA  0.81   CA  8.8   MG  1.6   PHOS  2.8   ALB  3.9   SGOT  25   ALT  52   INR  1.1       Medications reviewed  Current Facility-Administered Medications   Medication Dose Route Frequency   ??? [COMPLETED] morphine injection 4 mg  4 mg IntraVENous ONCE   ??? [COMPLETED] ondansetron (ZOFRAN) injection 8 mg  8 mg IntraVENous NOW   ??? [COMPLETED] aspirin chewable tablet 162 mg  162 mg Oral NOW   ??? [COMPLETED] ioversol (OPTIRAY) 350 mg iodine/mL contrast solution 100 mL  100 mL IntraVENous RAD ONCE   ??? [COMPLETED] sodium chloride (NS) flush 10 mL  10 mL IntraVENous RAD ONCE   ??? [COMPLETED] sodium chloride 0.9 % bolus infusion 100 mL  100 mL IntraVENous RAD ONCE   ??? [COMPLETED] nitroglycerin (NITROBID) 2 % ointment 1 Inch  1 Inch Topical NOW   ??? [COMPLETED] HYDROmorphone (PF) (DILAUDID) injection 1 mg  1 mg IntraVENous NOW   ??? sodium chloride (NS) flush 5-10 mL  5-10 mL IntraVENous Q8H   ??? sodium chloride (NS) flush 5-10 mL  5-10 mL IntraVENous PRN   ??? acetaminophen (TYLENOL) tablet 650 mg  650 mg Oral Q6H PRN   ??? morphine injection 1 mg  1 mg IntraVENous Q4H PRN   ??? heparin (porcine) injection 5,000 Units  5,000 Units SubCUTAneous Q8H   ??? amLODIPine (NORVASC) tablet 10 mg  10 mg Oral DAILY   ??? aspirin delayed-release tablet 81 mg  81 mg Oral DAILY   ??? cloNIDine (CATAPRESS) tablet 0.2 mg  0.2 mg Oral BID   ??? diazepam (VALIUM) tablet 5 mg  5 mg Oral Q6H PRN   ??? famotidine (PEPCID) tablet 40 mg  40 mg Oral BID   ??? FLUoxetine (PROZAC) capsule 40 mg  40 mg Oral BID   ??? gabapentin (NEURONTIN) capsule 300 mg  300 mg Oral BID   ??? hydrochlorothiazide (HYDRODIURIL) tablet 25 mg  25 mg Oral DAILY   ??? losartan (COZAAR) tablet 100  mg  100 mg Oral DAILY   ??? mirtazapine (REMERON) tablet 15 mg  15 mg Oral QHS   ??? potassium chloride SR (KLOR-CON 10) tablet 20 mEq  20 mEq Oral DAILY   ??? pramipexole (MIRAPEX) tablet 0.125 mg  0.125 mg Oral QHS   ??? [COMPLETED] potassium chloride SR (KLOR-CON 10) tablet 40 mEq  40 mEq Oral NOW   ??? pneumococcal 23-valent (PNEUMOVAX 23) injection 0.5 mL  0.5 mL IntraMUSCular PRIOR TO DISCHARGE   ??? [COMPLETED] nitroglycerin (NITROSTAT) tablet 0.4 mg  0.4 mg SubLINGual Q5MIN PRN         Erica Pod, MD  Internal Medicine  02/16/2012

## 2012-02-16 NOTE — H&P (Signed)
Admission History and Physical    ZOX:[id number]  Avelyn Touch  Age:47 y.o.  DOB:01-Dec-1965  Date Of Admission: 02/15/2012 11:49 PM  PCP: Lynnae Prude, MD      Chief Complaint   Patient presents with   ??? Chest Pain       HPI:  48 y/o AAF with h/o malignant HTN comes to Ed with complaints of chest pain radiating to the back since this evening. Pt is visiting here from [facility] and noted to have some nausea and chest pain after she had her dinner. She took some ASA. Had similar pain 2 weeks ago and evalauted at ED with ECHO which she was told had diastolic HF.  In ED, Ces negative, given NTG and now notes improvement in her CP. No SOB. Admitted for further management  1.  Past Medical History   Diagnosis Date   ??? Diastolic CHF    ??? Hypertension        2.SOCIAL HX:    Code status:FULL    3.  Allergies   Allergen Reactions   ??? Ace Inhibitors Hives and Swelling       4.  History     Social History   ??? Marital Status: SINGLE     Spouse Name: N/A     Number of Children: N/A   ??? Years of Education: N/A     Social History Main Topics   ??? Smoking status: Never Smoker    ??? Smokeless tobacco: Not on file   ??? Alcohol Use: No   ??? Drug Use: No   ??? Sexually Active: Not on file     Other Topics Concern   ??? Not on file     Social History Narrative   ??? No narrative on file     6.History reviewed. No pertinent past surgical history.   7.History reviewed. No pertinent family history.      Review of Systems:     Constitutional: Negative for fever, chills, malaise/fatigue and diaphoresis.   Skin: Negative for rashes, pressure ulcers   HEENT: negative for  tinnitus,epistaxis,decreased hearing  Eyes: Negative for diplopia, myopia,pain   Cardiovascular: Pos for chest pain, neg for palpitations, orthopnea and PND.   Respiratory: Negative for sputum production and wheezing.Marland Kitchen Pos for shortness of breath    Gastrointestinal: Negative for nausea, vomiting, abdominal pain, diarrhea and constipation.   Genitourinary: Negative for dysuria, urgency,  frequency and hematuria.   Musculoskeletal: Negative for neck pain, back pain and joint pain.   Endo/Heme/Allergies: Negative.for bleeding, allergic rhinitis  Neurological: Negative for sz,dizziness,syncope   Psychiatric: Negative for depression,hallucinations and anxiety        Physical Examination:   BP 120/59   Pulse 68   Resp 19   Ht 5\' 7"  (1.702 m)   Wt 127.007 kg (280 lb)   BMI 43.84 kg/m2   SpO2 96%    GENERAL:awake alert not in acute distress.  HEENT: Eyes are PERRLA. Sclerae are white. Conjunctivae are  pink. Ears not examined. Nose is normal. Mouth has moist  membranes with a clear throat.  NECK: Supple. No JVD, no thyromegaly, no bruits.  CHEST:no rhonchi, no crackles, or wheezes  HEART: Regular rate and rhythm without any murmurs, gallops, or  rubs.  ABDOMEN: Soft, nondistended, nontender. Normoactive bowel  sounds. No hepatosplenomegaly, no bruit, no masses.  EXTREMITIES: No edema. Good pulses, well perfused, and warm to  the touch.  NEURO: a/o x 3, no focal findings  SKIN: no decubitus ulcers,  no rashes      Chest Xray:  IMPRESSION:   No acute cardiopulmonary process.     CT chest/abdomen:  IMPRESSION:   1. No evidence of aortic dissection.   2. Cholelithiasis without evidence of cholecystitis.   3. Uterine fibroids.   4. Mild cardiomegaly.       ASSESMENT/Plan:   1. Chest pain: admit. Now improved. Check Ces. Get records from [place]. CT chest neg. Will get cardiology consult in am.  2. H/o malignant HTN: controlled here. Resume outpt meds.  3. DVT prophylaxis: sq heparin  4. Hypokalemia: replete        Daune Perch, MDM.D.

## 2012-02-16 NOTE — Progress Notes (Signed)
Clinical reviewed for medical necessity. SW to f/u with discharge planning. Jeanette Woodfin RN CM

## 2012-02-18 LAB — EKG, 12 LEAD, INITIAL
Atrial Rate: 61 {beats}/min
Calculated P Axis: 27 degrees
Calculated R Axis: 9 degrees
Calculated T Axis: 36 degrees
P-R Interval: 162 ms
Q-T Interval: 412 ms
QRS Duration: 96 ms
QTC Calculation (Bezet): 414 ms
Ventricular Rate: 61 {beats}/min

## 2013-01-15 ENCOUNTER — Emergency Department (HOSPITAL_COMMUNITY): Payer: Medicaid - Out of State

## 2013-01-15 ENCOUNTER — Emergency Department (HOSPITAL_COMMUNITY)
Admission: EM | Admit: 2013-01-15 | Discharge: 2013-01-15 | Disposition: A | Discharge status: Home / Self Care | Payer: Medicaid - Out of State | Attending: Emergency Medicine | Admitting: Emergency Medicine

## 2013-01-15 ENCOUNTER — Encounter (HOSPITAL_COMMUNITY): Payer: Self-pay | Admitting: Emergency Medicine

## 2013-01-15 DIAGNOSIS — R011 Cardiac murmur, unspecified: Secondary | ICD-10-CM | POA: Insufficient documentation

## 2013-01-15 DIAGNOSIS — Z79899 Other long term (current) drug therapy: Secondary | ICD-10-CM | POA: Insufficient documentation

## 2013-01-15 DIAGNOSIS — K567 Ileus, unspecified: Secondary | ICD-10-CM

## 2013-01-15 DIAGNOSIS — R079 Chest pain, unspecified: Secondary | ICD-10-CM | POA: Insufficient documentation

## 2013-01-15 DIAGNOSIS — R109 Unspecified abdominal pain: Secondary | ICD-10-CM | POA: Insufficient documentation

## 2013-01-15 DIAGNOSIS — Z9089 Acquired absence of other organs: Secondary | ICD-10-CM | POA: Insufficient documentation

## 2013-01-15 DIAGNOSIS — G43909 Migraine, unspecified, not intractable, without status migrainosus: Secondary | ICD-10-CM | POA: Insufficient documentation

## 2013-01-15 DIAGNOSIS — K219 Gastro-esophageal reflux disease without esophagitis: Secondary | ICD-10-CM | POA: Insufficient documentation

## 2013-01-15 DIAGNOSIS — R110 Nausea: Secondary | ICD-10-CM | POA: Insufficient documentation

## 2013-01-15 DIAGNOSIS — I10 Essential (primary) hypertension: Secondary | ICD-10-CM | POA: Insufficient documentation

## 2013-01-15 DIAGNOSIS — F17200 Nicotine dependence, unspecified, uncomplicated: Secondary | ICD-10-CM | POA: Insufficient documentation

## 2013-01-15 DIAGNOSIS — Z7982 Long term (current) use of aspirin: Secondary | ICD-10-CM | POA: Insufficient documentation

## 2013-01-15 DIAGNOSIS — F3289 Other specified depressive episodes: Secondary | ICD-10-CM | POA: Insufficient documentation

## 2013-01-15 DIAGNOSIS — E669 Obesity, unspecified: Secondary | ICD-10-CM | POA: Insufficient documentation

## 2013-01-15 DIAGNOSIS — K560 Paralytic ileus: Secondary | ICD-10-CM | POA: Insufficient documentation

## 2013-01-15 DIAGNOSIS — F329 Major depressive disorder, single episode, unspecified: Secondary | ICD-10-CM | POA: Insufficient documentation

## 2013-01-15 HISTORY — DX: Major depressive disorder, single episode, unspecified: F32.9

## 2013-01-15 HISTORY — DX: Obesity, unspecified: E66.9

## 2013-01-15 HISTORY — DX: Migraine, unspecified, not intractable, without status migrainosus: G43.909

## 2013-01-15 HISTORY — DX: Gastro-esophageal reflux disease without esophagitis: K21.9

## 2013-01-15 HISTORY — DX: Depression, unspecified: F32.A

## 2013-01-15 LAB — CBC
HCT: 37.1 % (ref 36.0–46.0)
Hemoglobin: 12.1 g/dL (ref 12.0–15.0)
MCH: 29.7 pg (ref 26.0–34.0)
MCHC: 32.6 g/dL (ref 30.0–36.0)
MCV: 90.9 fL (ref 78.0–100.0)
Platelets: 258 10*3/uL (ref 150–400)
RBC: 4.08 MIL/uL (ref 3.87–5.11)
RDW: 14.1 % (ref 11.5–15.5)
WBC: 8.9 10*3/uL (ref 4.0–10.5)

## 2013-01-15 LAB — COMPREHENSIVE METABOLIC PANEL
ALBUMIN: 3.7 g/dL (ref 3.5–5.2)
ALT: 13 U/L (ref 0–35)
AST: 11 U/L (ref 0–37)
Alkaline Phosphatase: 60 U/L (ref 39–117)
BUN: 10 mg/dL (ref 6–23)
CO2: 27 mEq/L (ref 19–32)
CREATININE: 0.78 mg/dL (ref 0.50–1.10)
Calcium: 8.8 mg/dL (ref 8.4–10.5)
Chloride: 102 mEq/L (ref 96–112)
GFR calc Af Amer: >90 mL/min (ref >90)
GFR calc non Af Amer: >90 mL/min (ref >90)
Glucose, Bld: 138 mg/dL — ABNORMAL HIGH (ref 70–99)
POTASSIUM: 3.5 meq/L — AB (ref 3.7–5.3)
Sodium: 143 mEq/L (ref 137–147)
TOTAL PROTEIN: 7.6 g/dL (ref 6.0–8.3)
Total Bilirubin: 0.3 mg/dL (ref 0.3–1.2)

## 2013-01-15 LAB — POCT I-STAT, CHEM 8
BUN: 9 mg/dL (ref 6–23)
CHLORIDE: 104 meq/L (ref 96–112)
Calcium, Ion: 1.13 mmol/L (ref 1.12–1.23)
Creatinine, Ser: 0.9 mg/dL (ref 0.50–1.10)
Glucose, Bld: 140 mg/dL — ABNORMAL HIGH (ref 70–99)
HCT: 39 % (ref 36.0–46.0)
Hemoglobin: 13.3 g/dL (ref 12.0–15.0)
Potassium: 3.2 mEq/L — ABNORMAL LOW (ref 3.7–5.3)
Sodium: 143 mEq/L (ref 137–147)
TCO2: 24 mmol/L (ref 0–100)

## 2013-01-15 LAB — POCT I-STAT TROPONIN I: Troponin i, poc: 0 ng/mL (ref 0.00–0.08)

## 2013-01-15 LAB — POCT PREGNANCY, URINE: Preg Test, Ur: NEGATIVE

## 2013-01-15 LAB — LIPASE, BLOOD: LIPASE: 17 U/L (ref 11–59)

## 2013-01-15 MED ORDER — IOHEXOL 300 MG/ML  SOLN
25.0000 mL | INTRAMUSCULAR | Status: AC
  Administered 2013-01-15: 25 mL via ORAL

## 2013-01-15 MED ORDER — ONDANSETRON 4 MG PO TBDP: 4.0000 mg | ORAL_TABLET | Freq: Three times a day (TID) | ORAL | Status: AC | PRN

## 2013-01-15 MED ORDER — GI COCKTAIL ~~LOC~~
30.0000 mL | Freq: Once | ORAL | Status: AC
  Administered 2013-01-15: 30 mL via ORAL
  Filled 2013-01-15: qty 30

## 2013-01-15 MED ORDER — OXYCODONE-ACETAMINOPHEN 5-325 MG PO TABS: 2.0000 | ORAL_TABLET | ORAL | Status: AC | PRN

## 2013-01-15 MED ORDER — IOHEXOL 300 MG/ML  SOLN
100.0000 mL | Freq: Once | INTRAMUSCULAR | Status: AC | PRN
  Administered 2013-01-15: 100 mL via INTRAVENOUS

## 2013-01-15 MED ORDER — ASPIRIN 81 MG PO CHEW: 324.0000 mg | CHEWABLE_TABLET | Freq: Once | ORAL | Status: DC

## 2013-01-15 MED ORDER — NITROGLYCERIN 0.4 MG SL SUBL: 0.4000 mg | SUBLINGUAL_TABLET | SUBLINGUAL | Status: DC | PRN

## 2013-01-15 MED ORDER — ONDANSETRON HCL 4 MG/2ML IJ SOLN
4.0000 mg | Freq: Once | INTRAMUSCULAR | Status: AC
  Administered 2013-01-15: 4 mg via INTRAVENOUS
  Filled 2013-01-15: qty 2

## 2013-01-15 MED ORDER — PROMETHAZINE HCL 25 MG PO TABS: 25.0000 mg | ORAL_TABLET | Freq: Four times a day (QID) | ORAL | Status: AC | PRN

## 2013-01-15 NOTE — ED Notes (Signed)
Phlebotomy at bedside.

## 2013-01-15 NOTE — ED Provider Notes (Signed)
CSN: [id number]     Arrival date & time 01/15/13  0012 History   First MD Initiated Contact with Patient 01/15/13 [phone]     Chief Complaint  Patient presents with  . Chest Pain   (Consider location/radiation/quality/duration/timing/severity/associated sxs/prior Treatment) HPI Comments: 48 year old female, history of hypertension, obesity and acid reflux disease. She is currently taking hydrochlorothiazide and has been diagnosed with hypokalemia requiring daily increased supplementation. She complains of chest pain which is central, heaviness like a brick on her chest according to her description, radiation to the right shoulder. This started this evening, she had been nauseated throughout the evening, was unable to eat dinner because of her nausea and upon leaving church stopped in the bathroom where she developed chest pain. Nothing seems to make it better, or worse, not associated with diaphoresis coughing fevers swelling of the legs, trauma, travel, immobilization, estrogen use. She does use black and mild tobacco products. She was admitted to the hospital one year ago and evaluated for chest pain however she states that this is different than that was. She also endorses belching foul-smelling gas but denies abdominal pain or distention  Patient is a 48 y.o. female presenting with chest pain. The history is provided by the patient and a friend.  Chest Pain   Past Medical History  Diagnosis Date  . Hypertension   . Heart murmur   . Depression   . Migraines   . GERD (gastroesophageal reflux disease)   . Obesity    Past Surgical History  Procedure Laterality Date  . Cholecystectomy    . Wisdom tooth extraction     No family history on file. History  Substance Use Topics  . Smoking status: Current Every Day Smoker -- 1.00 packs/day for 5 years    Types: Cigarettes  . Smokeless tobacco: Not on file  . Alcohol Use: Yes     Comment: occasional   OB History   Grav Para Term Preterm  Abortions TAB SAB Ect Mult Living                 Review of Systems  Cardiovascular: Positive for chest pain.  All other systems reviewed and are negative.    Allergies  Ace inhibitors  Home Medications   Current Outpatient Rx  Name  Route  Sig  Dispense  Refill  . amLODipine (NORVASC) 10 MG tablet   Oral   Take 10 mg by mouth daily.         Marland Kitchen aspirin EC 81 MG tablet   Oral   Take 81 mg by mouth daily.         . butalbital-acetaminophen-caffeine (FIORICET, ESGIC) 50-325-40 MG per tablet   Oral   Take 1 tablet by mouth every 6 (six) hours as needed for headache. 15 tablets must last 30 days         . butorphanol (STADOL) 10 MG/ML nasal spray   Nasal   Place 1 spray into the nose every 4 (four) hours as needed. pain         . ciprofloxacin (CIPRO) 500 MG tablet   Oral   Take 500 mg by mouth 2 (two) times daily.         . cloNIDine (CATAPRES) 0.3 MG tablet   Oral   Take 0.3 mg by mouth 2 (two) times daily.         . cyclobenzaprine (FLEXERIL) 10 MG tablet   Oral   Take 10 mg by mouth at bedtime.         Marland Kitchen  docusate sodium (COLACE) 100 MG capsule   Oral   Take 100 mg by mouth 2 (two) times daily.         Marland Kitchen etodolac (LODINE) 500 MG tablet   Oral   Take 500 mg by mouth 2 (two) times daily.         Marland Kitchen FLUoxetine (PROZAC) 40 MG capsule   Oral   Take 40 mg by mouth 2 (two) times daily.          . folic acid (FOLVITE) 1 MG tablet   Oral   Take 1 mg by mouth daily.         . furosemide (LASIX) 40 MG tablet   Oral   Take 40 mg by mouth daily.         Marland Kitchen gabapentin (NEURONTIN) 300 MG capsule   Oral   Take 300 mg by mouth 3 (three) times daily.         . hydrALAZINE (APRESOLINE) 10 MG tablet   Oral   Take 10 mg by mouth 3 (three) times daily.         . hydrALAZINE (APRESOLINE) 50 MG tablet   Oral   Take 50 mg by mouth 3 (three) times daily.         Marland Kitchen losartan (COZAAR) 100 MG tablet   Oral   Take 1 tablet (100 mg total) by  mouth daily.   15 tablet   0   . metoprolol succinate (TOPROL-XL) 50 MG 24 hr tablet   Oral   Take 50 mg by mouth daily. Take with or immediately following a meal.         . mirtazapine (REMERON) 15 MG tablet   Oral   Take 15 mg by mouth at bedtime.         . Multiple Vitamin (MULTIVITAMIN WITH MINERALS) TABS   Oral   Take 1 tablet by mouth daily.         Marland Kitchen nystatin (MYCOSTATIN) 100000 UNIT/ML suspension   Oral   Take 5 mLs by mouth 4 (four) times daily.         Marland Kitchen omeprazole (PRILOSEC) 20 MG capsule   Oral   Take 20 mg by mouth daily.         . potassium chloride SA (K-DUR,KLOR-CON) 20 MEQ tablet   Oral   Take 20 mEq by mouth 2 (two) times daily.         . pramipexole (MIRAPEX) 0.125 MG tablet   Oral   Take 0.125 mg by mouth at bedtime.          . promethazine (PHENERGAN) 25 MG tablet   Oral   Take 25 mg by mouth every 6 (six) hours as needed for nausea or vomiting.         . ranitidine (ZANTAC) 300 MG tablet   Oral   Take 300 mg by mouth at bedtime.         . sucralfate (CARAFATE) 1 GM/10ML suspension   Oral   Take 1 g by mouth 4 (four) times daily.         . Vitamin D, Ergocalciferol, (DRISDOL) 50000 UNITS CAPS   Oral   Take 50,000 Units by mouth every 7 (seven) days.          Marland Kitchen zolmitriptan (ZOMIG-ZMT) 5 MG disintegrating tablet   Oral   Take 5 mg by mouth as needed for migraine.         . ondansetron (ZOFRAN ODT) 4  MG disintegrating tablet   Oral   Take 1 tablet (4 mg total) by mouth every 8 (eight) hours as needed for nausea.   10 tablet   0   . oxyCODONE-acetaminophen (PERCOCET/ROXICET) 5-325 MG per tablet   Oral   Take 2 tablets by mouth every 4 (four) hours as needed for severe pain.   6 tablet   0   . promethazine (PHENERGAN) 25 MG tablet   Oral   Take 1 tablet (25 mg total) by mouth every 6 (six) hours as needed for nausea or vomiting.   12 tablet   0    BP 150/73  Pulse 74  Temp(Src) 99.1 F (37.3 C) (Oral)   Resp 24  Ht 5\' 7"  (1.702 m)  Wt 279 lb (126.554 kg)  BMI 43.69 kg/m2  SpO2 98%  LMP 12/22/2012 Physical Exam  Nursing note and vitals reviewed. Constitutional: She appears well-developed and well-nourished. No distress.  HENT:  Head: Normocephalic and atraumatic.  Mouth/Throat: Oropharynx is clear and moist. No oropharyngeal exudate.  Eyes: Conjunctivae and EOM are normal. Pupils are equal, round, and reactive to light. Right eye exhibits no discharge. Left eye exhibits no discharge. No scleral icterus.  Neck: Normal range of motion. Neck supple. No JVD present. No thyromegaly present.  Cardiovascular: Normal rate, regular rhythm and intact distal pulses.  Exam reveals no gallop and no friction rub.   Murmur heard. Pulmonary/Chest: Effort normal and breath sounds normal. No respiratory distress. She has no wheezes. She has no rales.  Abdominal: Soft. Bowel sounds are normal. She exhibits no distension and no mass. There is no tenderness.  Musculoskeletal: Normal range of motion. She exhibits no edema and no tenderness.  Lymphadenopathy:    She has no cervical adenopathy.  Neurological: She is alert. Coordination normal.  Skin: Skin is warm and dry. No rash noted. No erythema.  Psychiatric: She has a normal mood and affect. Her behavior is normal.    ED Course  Procedures (including critical care time) Labs Review Labs Reviewed  COMPREHENSIVE METABOLIC PANEL - Abnormal; Notable for the following:    Potassium 3.5 (*)    Glucose, Bld 138 (*)    All other components within normal limits  POCT I-STAT, CHEM 8 - Abnormal; Notable for the following:    Potassium 3.2 (*)    Glucose, Bld 140 (*)    All other components within normal limits  CBC  LIPASE, BLOOD  POCT I-STAT TROPONIN I  POCT PREGNANCY, URINE   Imaging Review Ct Abdomen Pelvis W Contrast  01/15/2013   CLINICAL DATA:  Acute onset central chest pressure radiating to the back. Shortness of breath and nausea. Epigastric  pain. Air-fluid levels on the plain radiographs.  EXAM: CT ABDOMEN AND PELVIS WITH CONTRAST  TECHNIQUE: Multidetector CT imaging of the abdomen and pelvis was performed using the standard protocol following bolus administration of intravenous contrast.  CONTRAST:  100mL OMNIPAQUE IOHEXOL 300 MG/ML  SOLN  COMPARISON:  None.  FINDINGS: The lung bases are clear.  Surgical absence of the gallbladder. The liver, spleen, pancreas, adrenal glands, kidneys, abdominal aorta, inferior vena cava, and retroperitoneal lymph nodes are unremarkable. The stomach is mildly distended without wall thickening. Small bowel and colon are not abnormally distended. Fluid and air-fluid levels are demonstrated in the small bowel and colon. No wall thickening. Changes likely to represent ileus. No free air or free fluid in the abdomen.  Pelvis: Enlarged uterus with Switch intrauterine device present. The curvilinear calcifications. Changes  are consistent with fibroids. No abnormal adnexal masses. Appendix is normal. Stool-filled rectosigmoid colon without evidence of diverticulitis. No free or loculated pelvic fluid collections.  IMPRESSION: No evidence of bowel obstruction. Diffuse fluid-filled nondistended small and large bowel consistent with ileus. Multinodular enlargement of the uterus consistent with fibroids.   Electronically Signed   By: Burman Nieves M.D.   On: 01/15/2013 06:20   Dg Abd Acute W/chest  01/15/2013   CLINICAL DATA:  Shortness of breath, abdominal discomfort and belching for 1 day.  EXAM: ACUTE ABDOMEN SERIES (ABDOMEN 2 VIEW & CHEST 1 VIEW)  COMPARISON:  Chest 01/19/2012  FINDINGS: Shallow inspiration. Normal heart size and pulmonary vascularity. No focal airspace disease or consolidation in the lungs. No blunting of costophrenic angles. No pneumothorax. No significant change since previous chest  Gas and stool in the colon without distention. Nonspecific gas-filled mid abdominal small bowel loops with mild  distention and air-fluid levels demonstrated. Early or partial small bowel obstruction is not excluded. The stomach is distended. No free air. No radiopaque stones. Surgical clips in the right side of the abdomen. Degenerative changes in the spine and hips.  IMPRESSION: No evidence of active pulmonary disease. Mild small bowel distention with some air-fluid levels may represent early obstruction. The stomach is distended.   Electronically Signed   By: Burman Nieves M.D.   On: 01/15/2013 01:50    EKG Interpretation    Date/Time:  Thursday January 15 2013 00:23:32 EST Ventricular Rate:  81 PR Interval:  177 QRS Duration: 90 QT Interval:  384 QTC Calculation: 446 R Axis:   17 Text Interpretation:  Sinus rhythm Probable left atrial enlargement Probable left ventricular hypertrophy Borderline T abnormalities, anterior leads since last tracing no significant change Confirmed by Dulcey Riederer  MD, Mattias Walmsley ([id number]) on 01/15/2013 12:28:13 AM            MDM   1. Abdominal pain   2. Chest pain   3. Nausea   4. Ileus    The patient does not have any significant abnormalities on physical exam, she is aware of her baseline heart murmur, the EKG does not show any ischemic changes but does show left ventricular hypertrophy which the patient states she is aware of. She has very seen her primary care doctor this week, started on potassium supplementation which she has not taken appropriately. She was then seen twice in the emergency department feeling like the chest pain was related to hypokalemia. She does have hypertension requiring multiple different medications including hydralazine, hydrochlorothiazide, Cozaar, Norvasc, Toprol and clonidine. With her foul-smelling belches and a chest symptoms that radiate to her back I would also consider acid reflux which he already knows that she has, pancreatitis, cholecystitis though less likely given no abdominal tenderness in the right upper quadrant. He is very low risk  for pulmonary embolism, she is not hypertensive and has no neurologic complaints, aortic dissection would be less likely as well  2:00 AM, patient reexamined, soft abdomen, decreased bowel sounds, minimal tenderness in the epigastrium. Reviewed results with the patient, feels like she has to have a bowel movement, x-ray shows possible early bowel traction with air-fluid levels. The patient has had a cholecystectomy, consider scar tissue or other source. Again she has only had 24 hours of foul-smelling belching which could be a bowel obstruction. CT scan ordered  4 AM, patient persistently mildly nauseated, no chest pain, pending CT scan  6:45 AM, CT scan was resulted, ileus but no other acute findings,  no bowel traction. The patient has no chest pain, I reviewed her laboratory and imaging workup with her and have given her reassurance that this is very unlikely to be cardiac in nature. She will be given medications per home, encouraged to follow up very closely with her family doctor.  She has expressed her understanding   Meds given in ED:  Medications  aspirin chewable tablet 324 mg (324 mg Oral Not Given 01/15/13 0049)  nitroGLYCERIN (NITROSTAT) SL tablet 0.4 mg (not administered)  iohexol (OMNIPAQUE) 300 MG/ML solution 25 mL (25 mLs Oral Contrast Given 01/15/13 0218)  gi cocktail (Maalox,Lidocaine,Donnatal) (30 mLs Oral Given 01/15/13 0127)  iohexol (OMNIPAQUE) 300 MG/ML solution 100 mL (100 mLs Intravenous Contrast Given 01/15/13 0527)  ondansetron (ZOFRAN) injection 4 mg (4 mg Intravenous Given 01/15/13 0653)    New Prescriptions   ONDANSETRON (ZOFRAN ODT) 4 MG DISINTEGRATING TABLET    Take 1 tablet (4 mg total) by mouth every 8 (eight) hours as needed for nausea.   OXYCODONE-ACETAMINOPHEN (PERCOCET/ROXICET) 5-325 MG PER TABLET    Take 2 tablets by mouth every 4 (four) hours as needed for severe pain.   PROMETHAZINE (PHENERGAN) 25 MG TABLET    Take 1 tablet (25 mg total) by mouth every 6 (six)  hours as needed for nausea or vomiting.      Vida Roller, MD 01/15/13 [phone]

## 2013-01-15 NOTE — ED Notes (Signed)
Patients presents to ED via [facility]. Patient was at church when she states that she had a onset of central chest "pressure" that radiates to her back. Patient states that this same "pressure" has happened x2 this week. ED in [place] diagnosed her with low potassium. Also c/o of shortness of breath and nausea. Ambulatory from EMS stretcher to bed. Upon arrival to ED patient is A&Ox4. VSS.

## 2013-01-15 NOTE — Discharge Instructions (Signed)
Your testing has not revealed a specific answer to your symptoms - You do have an ileus - please read the attached instructions - you should take only liquids today - medicine for nausea / vomiting and pain as needed.  Please call your doctor for a followup appointment within 24-48 hours. When you talk to your doctor please let them know that you were seen in the emergency department and have them acquire all of your records so that they can discuss the findings with you and formulate a treatment plan to fully care for your new and ongoing problems.   Emergency Department Resource Guide 1) Find a Doctor and Pay Out of Pocket Although you won't have to find out who is covered by your insurance plan, it is a good idea to ask around and get recommendations. You will then need to call the office and see if the doctor you have chosen will accept you as a new patient and what types of options they offer for patients who are self-pay. Some doctors offer discounts or will set up payment plans for their patients who do not have insurance, but you will need to ask so you aren't surprised when you get to your appointment.  2) Contact Your Local Health Department Not all health departments have doctors that can see patients for sick visits, but many do, so it is worth a call to see if yours does. If you don't know where your local health department is, you can check in your phone book. The CDC also has a tool to help you locate your state's health department, and many state websites also have listings of all of their local health departments.  3) Find a Walk-in Clinic If your illness is not likely to be very severe or complicated, you may want to try a walk in clinic. These are popping up all over the country in pharmacies, drugstores, and shopping centers. They're usually staffed by nurse practitioners or physician assistants that have been trained to treat common illnesses and complaints. They're usually fairly  quick and inexpensive. However, if you have serious medical issues or chronic medical problems, these are probably not your best option.  No Primary Care Doctor: - Call Health Connect at  [phone] - they can help you locate a primary care doctor that  accepts your insurance, provides certain services, etc. - Physician Referral Service- [phone]  Chronic Pain Problems: Organization         Address  Phone   Notes  [facility]  [phone] Patients need to be referred by their primary care doctor.   Medication Assistance: Organization         Address  Phone   Notes  [facility] Medication [facility] [address] [place] [address], [place] [address] [phone] --Must be a resident of [facility] -- Must have NO insurance coverage whatsoever (no Medicaid/ Medicare, etc.) -- The pt. MUST have a primary care doctor that directs their care regularly and follows them in the community   [facility]  [phone]   [employer]  [phone]    Agencies that provide inexpensive medical care: Organization         Address  Phone   Notes  [facility]  [phone]   [facility]    [phone]   [facility] [address] [place], [place] [address] [phone]   [facility]  Lovenia Shuck, [place] [phone]   Planned Parenthood    [phone]   [facility]    [phone]   [facility]  [address], [place] Phone:  [phone], Fax:  [phone] Hours of Operation:  9 am - 6 pm, M-F.  Also accepts Medicaid/Medicare and self-pay.  [facility]  [address], [place] Phone: [phone], Fax: [phone]. Hours of Operation:  8:30 am - 5:30 pm, M-F.  Also accepts Medicaid and self-pay.  [facility] [place] [address] [place] Phone: [phone]    [facility] [address] Natasha Bence [place], [place] [phone], Ext. [phone] Mondays & Thursdays: 7-9 AM.  First 15 patients are seen on a first come, first serve basis.    Medicaid-accepting [facility] Providers:  Organization         Address  Phone   Notes  [facility] [address],  [phone] Also accepts self-pay patients.  [facility] [address] Laurell Josephs [place], [place]  [phone]   [facility] [address], [place] [phone]   [facility] [address] [place] [phone]   Renaye Rakers [address], [place]   [phone] Only accepts [place] patients after they have their name applied to their card.   Self-Pay (no insurance) in [facility]:  Organization         Address  Phone   Notes  Sickle Cell Patients, [facility] Internal Medicine [address] [place], [place] [phone]   [facility] [address] [place], [place] [phone]   [facility]  [address], [place] [phone]   [facility]/Dr. Osei-Bonsu  [address] [place] or [address], [place] [phone] Phone number for both [place] and [place] locations is the same.  [facility] [address] [place] [phone]   [facility] [address] [place] or [address] [phone]   [facility] [address], [place] [phone], phone; [phone], fax Sees patients 1st and 3rd Saturday of every month.  Must not qualify for public or private insurance (i.e. Medicaid, Medicare, [place] Health Choice, Veterans' Benefits)  Household income should be no more than 200% of the poverty level The clinic cannot treat you if you are  pregnant or think you are pregnant  Sexually transmitted diseases are not treated at the clinic.    Dental Care: Organization         Address  Phone  Notes  [facility] Department of [facility] [address] [place], [place] [phone] Accepts children up to age 71 who are enrolled in [place] or [place] Health Choice; pregnant women with a Medicaid card; and children who have applied for Medicaid or [place] Health Choice, but were declined, whose parents can pay a reduced fee at time of service.  [facility] Department of [facility]  [address], [place] [phone] Accepts children up to age 54 who are enrolled in [place] or [place] Health Choice; pregnant women with a Medicaid card; and children who have applied for  Medicaid or [place] Health Choice, but were declined, whose parents can pay a reduced fee at time of service.  [facility]  [address] [place], [place] [phone] Patients are seen by appointment only. Walk-ins are not accepted. [facility] will see patients 99 years of age and older. Monday - Tuesday (8am-5pm) Most Wednesdays (8:30-5pm) $30 per visit, cash only  [facility] Adult Dental Access [facility]  [address], [facility] [phone] Patients are seen by appointment only. Walk-ins are not accepted. [facility] will see patients 32 years of age and older. One Wednesday Evening (Monthly: Volunteer Based).  $30 per visit, cash only  [employer]  [phone] for adults; Children under age 68, call Graduate Pediatric Dentistry at [phone]. Children aged 34-14, please call [phone] to request a pediatric application.  Dental services are provided in all areas of dental care including fillings, crowns and bridges, complete and partial dentures, implants, gum treatment, root canals, and extractions. Preventive care is also provided. Treatment is provided to  both adults and children. Patients are selected via a lottery and there is often a waiting list.   [facility] [address] [place]  [phone] [email or web address]   [facility] [address] [place], [place] [phone], Ext. [phone] Second and Fourth Thursday of each month, opens at 6:30 AM; Clinic ends at 9 AM.  Patients are seen on a first-come first-served basis, and a limited number are seen during each clinic.   [facility]  [address] Ether Griffins [place], [place] [phone]   Eligibility Requirements You must have lived in [place], [place], or [place] counties for at least the last three months.   You cannot be eligible for state or federal sponsored [place], including [employer], [place], or [employer].   You generally cannot be eligible for healthcare insurance through your employer.    How to apply: Eligibility screenings are held every Tuesday and Wednesday afternoon from 1:00 pm until 4:00 pm. You do not need an appointment for the interview!  [facility] [address] [place], [place] [phone]   [facility] Health Department  [phone]   [facility] Health Department  [phone]   [facility]  [phone]    Behavioral Health Resources in the Community: Intensive Outpatient Programs Organization         Address  Phone  Notes  [facility] [address], [place], [place] [phone]   [facility] Outpatient [address], [place], [place] [phone]   [facility] [address], [place], [place]  [phone]   [facility] [address],  [place], [place] [phone] or [phone]   Substance Abuse Resources Organization         Address  Phone  Notes  [facility]  [phone]   [facility]  [phone]   [facility] [place]   [phone]   Floydene Flock  [phone]   Residential & Outpatient Substance Abuse Program  [phone]   Psychological Services Organization         Address  Phone  Notes  [facility] Behavioral Health  [phone]   [facility]  [phone]   [facility] [address] [place] [phone] or [phone]    Mobile Crisis Teams Organization         Address  Phone  Notes  [facility], Mobile Crisis Care Unit  [phone]   [facility]  [address] [place], [place] [phone]   [facility] South[facility]on DeEsch [address] [place] [phone]    Self-Help/Support Groups Organization         Address  Phone             Notes  [facility] [place] - variety of support groups  [phone]- [phone] Call for more information  Narcotics Anonymous (NA), [facility] [address], [place]  2 meetings at this location   Statisticianesidential Treatment Programs Organization         Address  Phone  Notes  [facility] [address],    [place]  [phone]   [facility]  [address], [place] [phone], [place], [place] [phone]   [facility] [address], [place] [phone] Admissions: 8am-3pm M-F  [facility] [address]    [place], [place] [phone]   [facility] [address], [place], [place] [phone]   [facility] [address]  [place], [place] [phone]   [facility] - Intensive Outpatient [address], [place], [place] [phone]   [facility] [address]  [place], [place] [phone] or [phone]   [facility] ([facility]) [address] [place], [place] [phone] Accepts Medicaid  [facility] [address]  [place] [phone] Substance Abuse/Addiction Treatment   [facility] Behavioral Health Resources Organization         Address  Phone  Notes  [facility]  [phone]   Angie FavaJulie Brannon, PhD [address] [place], [place]   [phone]) [phone] or [phone]   [facility]   [address] [place], [place] [phone](336) [phone]   [facility] [address], [place], [place] [phone]) [phone] Insurance/Medicaid/sponsorship through [facility]  [facility] [address]                                    [place], [place] [phone] Therapy/tele-psych/case  [facility] [address]   [place], [place] [phone]    Dr. Lolly MustacheArfeen  [phone]   [facility] [place] [facility]  [facility] 1) [address], [place] 2) [address], [place] 3)  [address], [place] [phone]  [phone]   [facility] Child Abuse Hotline [phone](336) [phone] or [phone] (After Hours)

## 2014-02-14 IMAGING — CT CT ABD-PELV W/ CM
2 of 5 series · 17 of 46 positions shown, 19 images · IV contrast (CONTRAST)
Comparison: None.

CLINICAL DATA: Acute onset central chest pressure radiating to the
back. Shortness of breath and nausea. Epigastric pain. Air-fluid
levels on the plain radiographs.

EXAM:
CT ABDOMEN AND PELVIS WITH CONTRAST
TECHNIQUE: Multidetector CT imaging of the abdomen and pelvis was performed
using the standard protocol following bolus administration of
intravenous contrast.
CONTRAST:  100mL OMNIPAQUE IOHEXOL 300 MG/ML  SOLN

[Series 2: routine · axial · 0.73mm/px · z∈[-483,-48]mm · 14 of 97 slices shown, 16 images]
[im 5/97  soft-tissue]
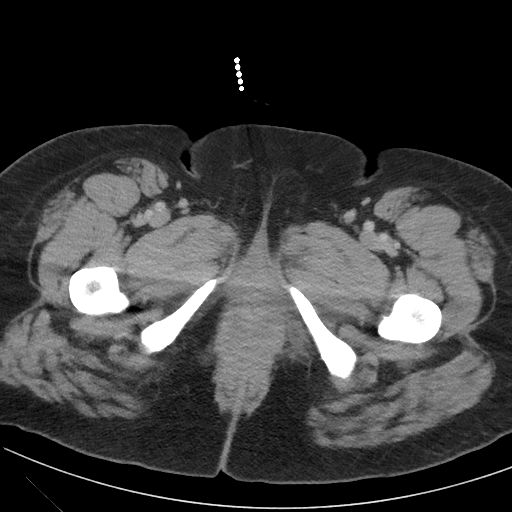
[im 5/97  bone]
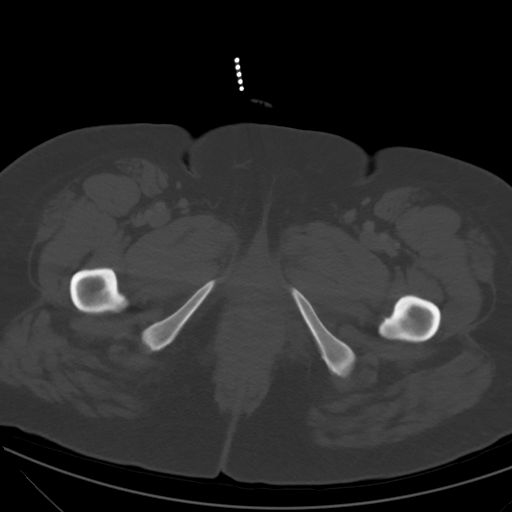
[im 15/97  soft-tissue]
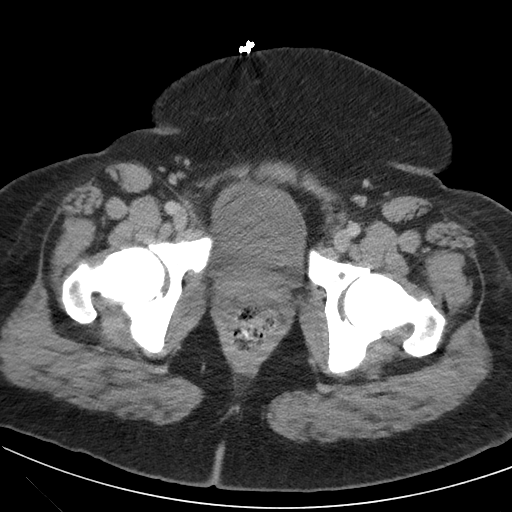
[im 20/97  soft-tissue]
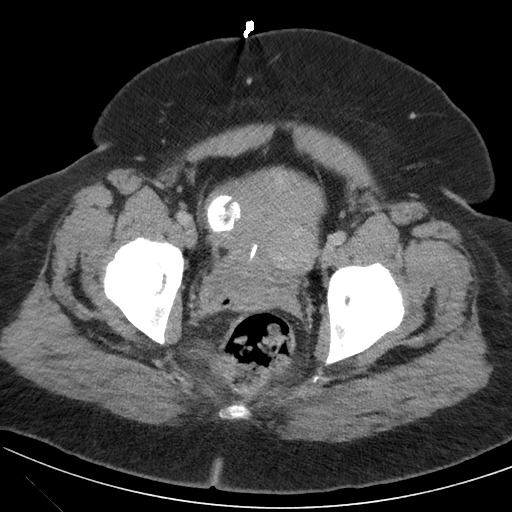
[im 25/97  soft-tissue]
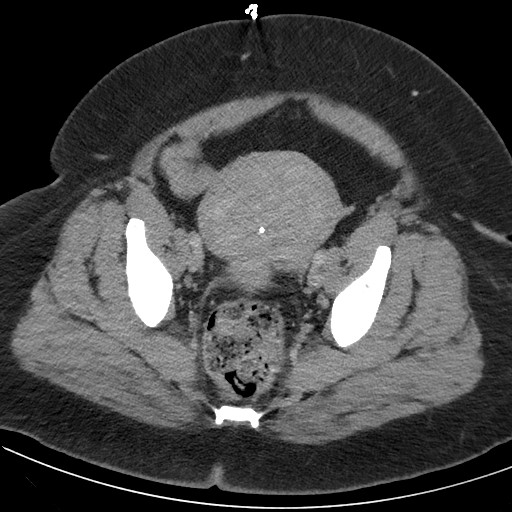
[im 34/97  soft-tissue]
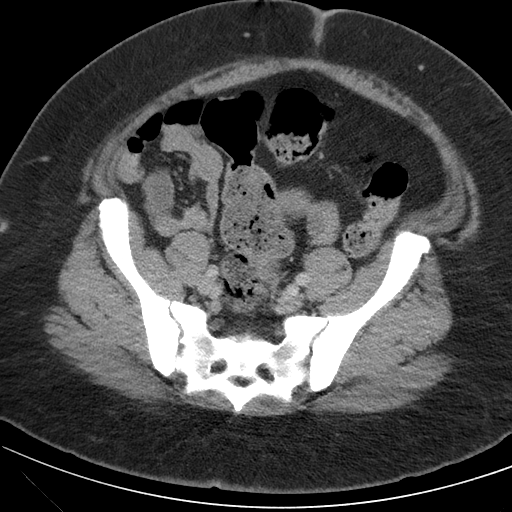
[im 39/97  soft-tissue]
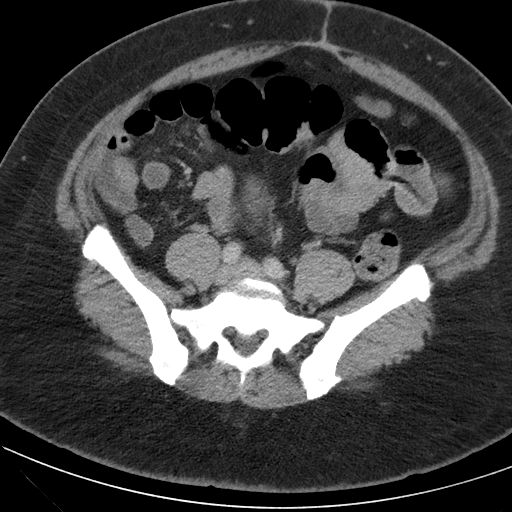
[im 44/97  soft-tissue]
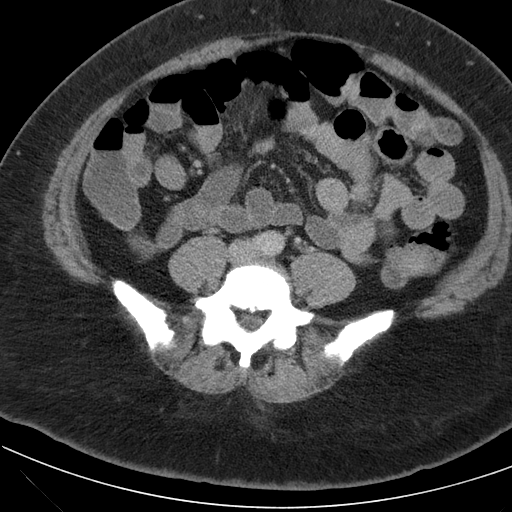
[im 53/97  soft-tissue]
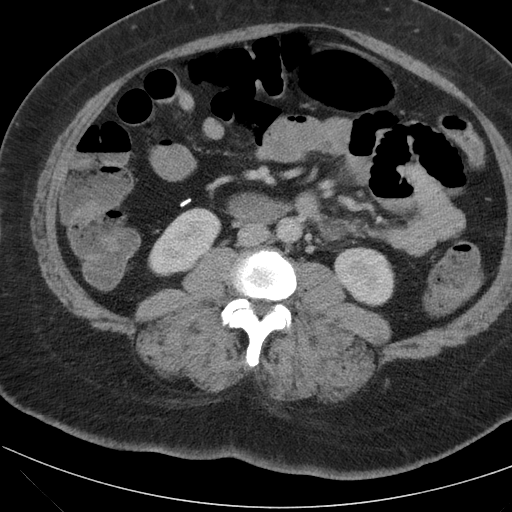
[im 58/97  soft-tissue]
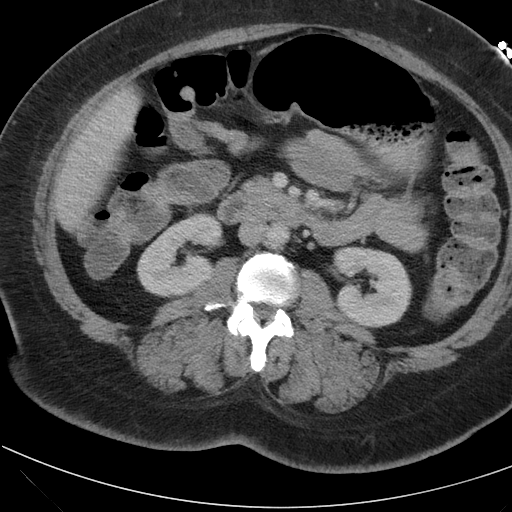
[im 58/97  bone]
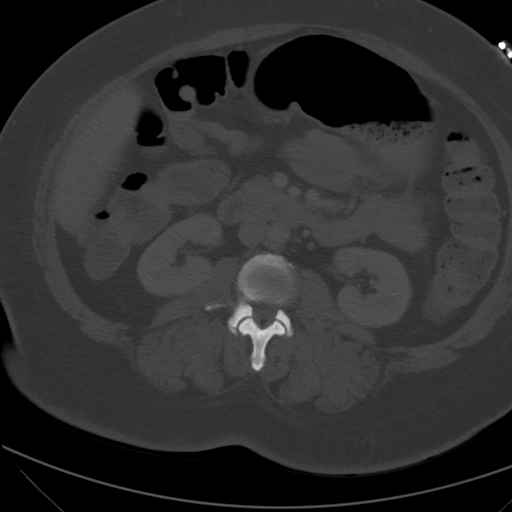
[im 63/97  soft-tissue]
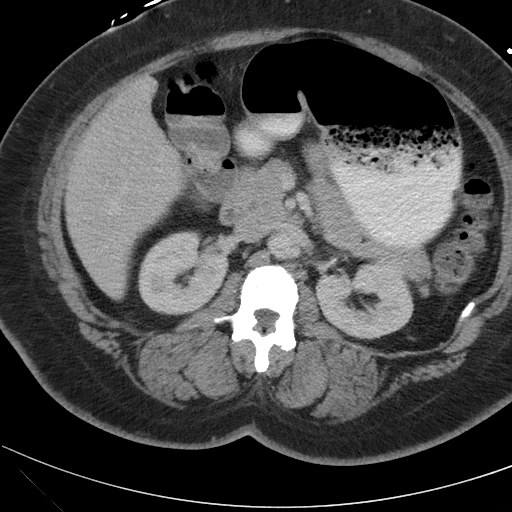
[im 73/97  soft-tissue]
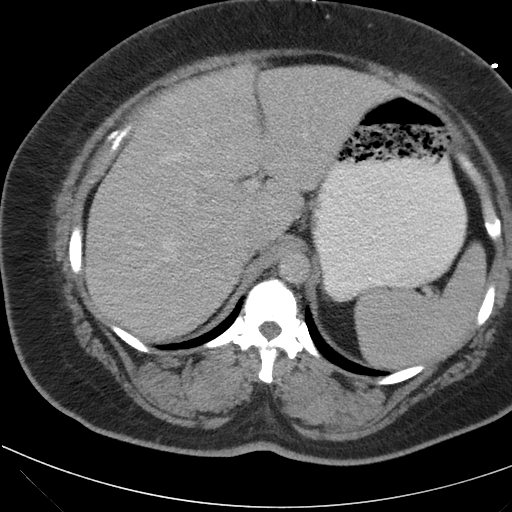
[im 77/97  soft-tissue]
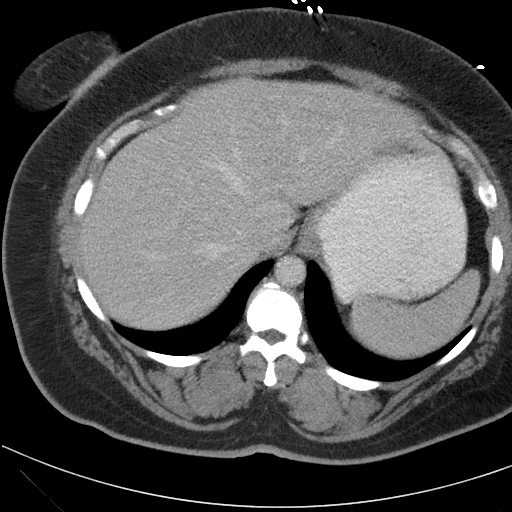
[im 82/97  soft-tissue]
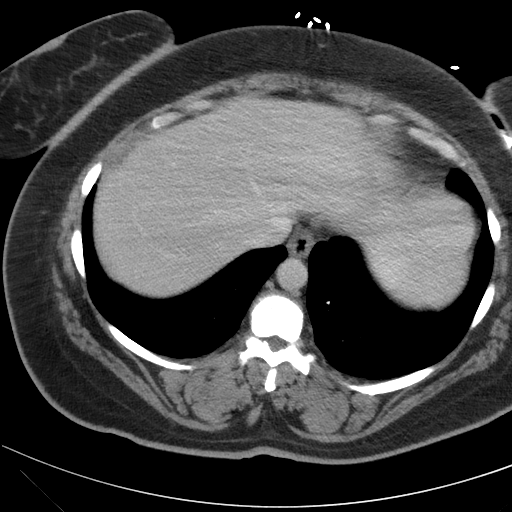
[im 92/97  soft-tissue]
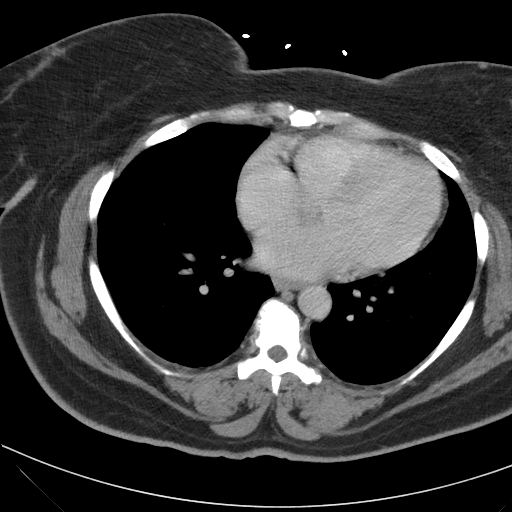

[coronal · coronal · 0.94mm/px · 3 of 156 slices shown]
[im 52/156  soft-tissue]
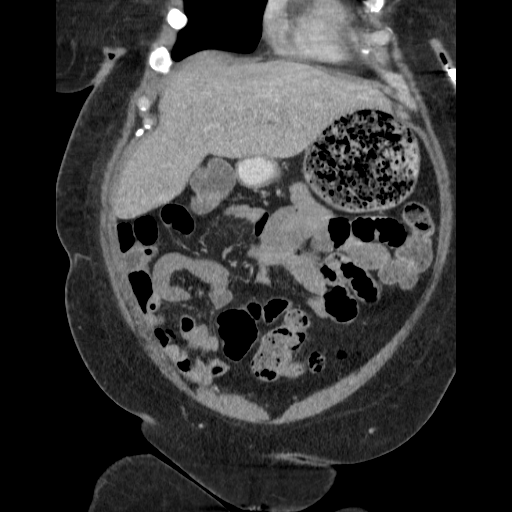
[im 69/156  soft-tissue]
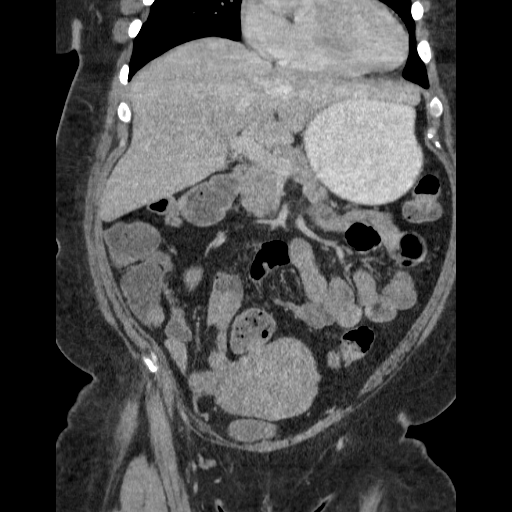
[im 87/156  soft-tissue]
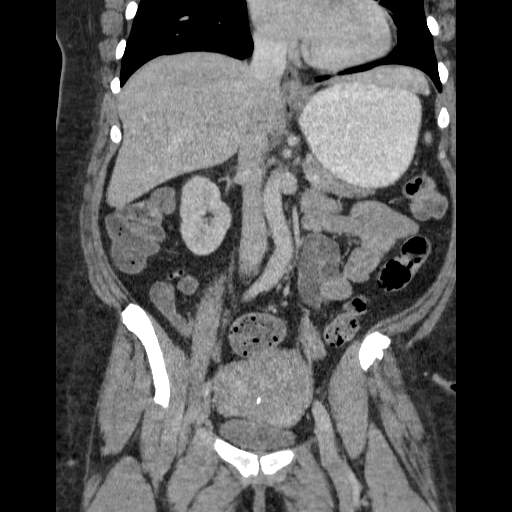

[17 of 46 positions shown; findings below may reference images not displayed]

FINDINGS: The lung bases are clear.

Surgical absence of the gallbladder. The liver, spleen, pancreas,
adrenal glands, kidneys, abdominal aorta, inferior vena cava, and
retroperitoneal lymph nodes are unremarkable. The stomach is mildly
distended without wall thickening. Small bowel and colon are not
abnormally distended. Fluid and air-fluid levels are demonstrated in
the small bowel and colon. No wall thickening. Changes likely to
represent ileus. No free air or free fluid in the abdomen.

Pelvis: Enlarged uterus with Switch intrauterine device present. The
curvilinear calcifications. Changes are consistent with fibroids. No
abnormal adnexal masses. Appendix is normal. Stool-filled
rectosigmoid colon without evidence of diverticulitis. No free or
loculated pelvic fluid collections.
IMPRESSION: No evidence of bowel obstruction. Diffuse fluid-filled nondistended
small and large bowel consistent with ileus. Multinodular
enlargement of the uterus consistent with fibroids.

## 2015-08-07 ENCOUNTER — Other Ambulatory Visit: Payer: Self-pay

## 2015-08-07 ENCOUNTER — Emergency Department (HOSPITAL_COMMUNITY)
Admission: EM | Admit: 2015-08-07 | Discharge: 2015-08-07 | Disposition: A | Discharge status: Home / Self Care | Payer: BLUE CROSS/BLUE SHIELD | Attending: Emergency Medicine | Admitting: Emergency Medicine

## 2015-08-07 DIAGNOSIS — F17200 Nicotine dependence, unspecified, uncomplicated: Secondary | ICD-10-CM | POA: Insufficient documentation

## 2015-08-07 DIAGNOSIS — I10 Essential (primary) hypertension: Secondary | ICD-10-CM | POA: Insufficient documentation

## 2015-08-07 DIAGNOSIS — Z7982 Long term (current) use of aspirin: Secondary | ICD-10-CM | POA: Insufficient documentation

## 2015-08-07 DIAGNOSIS — T7840XA Allergy, unspecified, initial encounter: Secondary | ICD-10-CM | POA: Insufficient documentation

## 2015-08-07 DIAGNOSIS — L299 Pruritus, unspecified: Secondary | ICD-10-CM | POA: Diagnosis present

## 2015-08-07 DIAGNOSIS — Z79899 Other long term (current) drug therapy: Secondary | ICD-10-CM | POA: Insufficient documentation

## 2015-08-07 MED ORDER — PREDNISONE 20 MG PO TABS: ORAL_TABLET | ORAL | 0 refills | Status: AC

## 2015-08-07 MED ORDER — PREDNISONE 20 MG PO TABS: ORAL_TABLET | ORAL | 0 refills | Status: DC

## 2015-08-07 NOTE — ED Provider Notes (Signed)
Formatting of this note is different from the original.  Images from the original note were not included.    [facility] DEPT  Provider Note    CSN: [id number]  Arrival date & time: 08/07/15  [id number]    First Provider Contact:  4:00    History    Chief Complaint  No chief complaint on file.    The history is provided by the patient. No language interpreter was used.     HPI Comments:  Erica Oconnor is a 50 y.o. female with a PMHx of HTN, GERD, depression, obesity, and migraines, who presents to the Emergency Department complaining of gradual onset, intermittent episodes of pruritic areas to her bilateral upper extremities, palms of her hands, and head onset PTA. She notes that she has associated tongue swelling. Pt reports that she was styling her hair this morning ~18 hours PTA at the onset of her symptoms. She notes that she uses a lot of different hair products, but denies using any type of new product.No new soaps, lotions, detergents, foods, animals, plants, or medications. She denies SOB, trouble breathing, or any other symptoms.    Past Medical History:   Diagnosis Date   ? Depression    ? GERD (gastroesophageal reflux disease)    ? Heart murmur    ? Hypertension    ? Migraines    ? Obesity      Patient Active Problem List    Diagnosis Date Noted   ? Diastolic dysfunction [id number]   ? Chest pain 01/19/2012   ? Hypertension 01/19/2012   ? GERD (gastroesophageal reflux disease) 01/19/2012     Past Surgical History:   Procedure Laterality Date   ? CHOLECYSTECTOMY     ? WISDOM TOOTH EXTRACTION       OB History     No data available       Home Medications      Prior to Admission medications    Medication Sig Start Date End Date Taking? Authorizing Provider   amLODipine (NORVASC) 10 MG tablet Take 10 mg by mouth daily.    Historical Provider, MD   aspirin EC 81 MG tablet Take 81 mg by mouth daily.    Historical Provider, MD   butalbital-acetaminophen-caffeine (FIORICET, ESGIC) 50-325-40 MG per tablet Take 1 tablet  by mouth every 6 (six) hours as needed for headache. 15 tablets must last 30 days    Historical Provider, MD   butorphanol (STADOL) 10 MG/ML nasal spray Place 1 spray into the nose every 4 (four) hours as needed. pain    Historical Provider, MD   ciprofloxacin (CIPRO) 500 MG tablet Take 500 mg by mouth 2 (two) times daily.    Historical Provider, MD   cloNIDine (CATAPRES) 0.3 MG tablet Take 0.3 mg by mouth 2 (two) times daily.    Historical Provider, MD   cyclobenzaprine (FLEXERIL) 10 MG tablet Take 10 mg by mouth at bedtime.    Historical Provider, MD   docusate sodium (COLACE) 100 MG capsule Take 100 mg by mouth 2 (two) times daily.    Historical Provider, MD   etodolac (LODINE) 500 MG tablet Take 500 mg by mouth 2 (two) times daily.    Historical Provider, MD   FLUoxetine (PROZAC) 40 MG capsule Take 40 mg by mouth 2 (two) times daily.     Historical Provider, MD   folic acid (FOLVITE) 1 MG tablet Take 1 mg by mouth daily.    Historical Provider, MD   furosemide (  LASIX) 40 MG tablet Take 40 mg by mouth daily.    Historical Provider, MD   gabapentin (NEURONTIN) 300 MG capsule Take 300 mg by mouth 3 (three) times daily.    Historical Provider, MD   hydrALAZINE (APRESOLINE) 10 MG tablet Take 10 mg by mouth 3 (three) times daily.    Historical Provider, MD   hydrALAZINE (APRESOLINE) 50 MG tablet Take 50 mg by mouth 3 (three) times daily.    Historical Provider, MD   losartan (COZAAR) 100 MG tablet Take 1 tablet (100 mg total) by mouth daily. 01/20/12   Oswald Hillock, MD   metoprolol succinate (TOPROL-XL) 50 MG 24 hr tablet Take 50 mg by mouth daily. Take with or immediately following a meal.    Historical Provider, MD   mirtazapine (REMERON) 15 MG tablet Take 15 mg by mouth at bedtime.    Historical Provider, MD   Multiple Vitamin (MULTIVITAMIN WITH MINERALS) TABS Take 1 tablet by mouth daily.    Historical Provider, MD   nystatin (MYCOSTATIN) 100000 UNIT/ML suspension Take 5 mLs by mouth 4 (four) times daily.     Historical Provider, MD   omeprazole (PRILOSEC) 20 MG capsule Take 20 mg by mouth daily.    Historical Provider, MD   ondansetron (ZOFRAN ODT) 4 MG disintegrating tablet Take 1 tablet (4 mg total) by mouth every 8 (eight) hours as needed for nausea. 01/15/13   Noemi Chapel, MD   oxyCODONE-acetaminophen (PERCOCET/ROXICET) 5-325 MG per tablet Take 2 tablets by mouth every 4 (four) hours as needed for severe pain. 01/15/13   Noemi Chapel, MD   potassium chloride SA (K-DUR,KLOR-CON) 20 MEQ tablet Take 20 mEq by mouth 2 (two) times daily.    Historical Provider, MD   pramipexole (MIRAPEX) 0.125 MG tablet Take 0.125 mg by mouth at bedtime.     Historical Provider, MD   predniSONE (DELTASONE) 20 MG tablet 2 tabs po daily x 4 days 08/07/15   [name], DO   promethazine (PHENERGAN) 25 MG tablet Take 25 mg by mouth every 6 (six) hours as needed for nausea or vomiting.    Historical Provider, MD   promethazine (PHENERGAN) 25 MG tablet Take 1 tablet (25 mg total) by mouth every 6 (six) hours as needed for nausea or vomiting. 01/15/13   Noemi Chapel, MD   ranitidine (ZANTAC) 300 MG tablet Take 300 mg by mouth at bedtime.    Historical Provider, MD   sucralfate (CARAFATE) 1 GM/10ML suspension Take 1 g by mouth 4 (four) times daily.    Historical Provider, MD   Vitamin D, Ergocalciferol, (DRISDOL) 50000 UNITS CAPS Take 50,000 Units by mouth every 7 (seven) days.     Historical Provider, MD   zolmitriptan (ZOMIG-ZMT) 5 MG disintegrating tablet Take 5 mg by mouth as needed for migraine.    Historical Provider, MD     Family History  No family history on file.    Social History  Social History   Substance Use Topics   ? Smoking status: Current Every Day Smoker     Packs/day: 1.00     Years: 5.00     Types: Cigarettes   ? Smokeless tobacco: Not on file   ? Alcohol use Yes      Comment: occasional     Allergies    Ace inhibitors    Review of Systems  Review of Systems   Constitutional: Negative for chills and fever.   HENT: Negative for  congestion, rhinorrhea and trouble  swallowing.         Positive for tongue swelling.   Eyes: Negative for redness and visual disturbance.   Respiratory: Negative for shortness of breath and wheezing.    Cardiovascular: Negative for chest pain and palpitations.   Gastrointestinal: Negative for nausea and vomiting.   Genitourinary: Negative for dysuria and urgency.   Musculoskeletal: Negative for arthralgias and myalgias.   Skin: Negative for pallor and wound.   Neurological: Negative for dizziness and headaches.   All other systems reviewed and are negative.    Physical Exam  Updated Vital Signs  BP 130/81   Pulse 81   Temp 103 F (39.4 C) (Oral)   Resp 12   Ht 5\' 3"  (1.6 m)   Wt 165 lb (74.8 kg)   SpO2 90%   BMI 29.23 kg/m     Physical Exam   Constitutional: She is oriented to person, place, and time. She appears well-developed and well-nourished.   HENT:   Head: Normocephalic and atraumatic.   No appreciable tongue swelling.   Eyes: EOM are normal.   Neck: Normal range of motion.   Cardiovascular: Normal rate, regular rhythm and normal heart sounds.    No murmur heard.  Pulmonary/Chest: Effort normal. No respiratory distress. She has no wheezes. She has no rales.   Abdominal: Soft.   Musculoskeletal: Normal range of motion.   Neurological: She is alert and oriented to person, place, and time.   Skin: Skin is warm and dry. Capillary refill takes less than 2 seconds. No rash noted. No erythema.   Psychiatric: She has a normal mood and affect.   Nursing note and vitals reviewed.    ED Treatments / Results   Labs  (all labs ordered are listed, but only abnormal results are displayed)  Labs Reviewed - No data to display    EKG   EKG Interpretation  None        Radiology  No results found.    Procedures  Procedures (including critical care time)    Medications Ordered in ED  Medications - No data to display    Initial Impression / Assessment and Plan / ED Course   I have reviewed the triage vital signs and the  nursing notes.    Pertinent labs & imaging results that were available during my care of the patient were reviewed by me and considered in my medical decision making (see chart for details).    Clinical Course     50 yo F With a chief complaint of a possible allergic reaction. Patient with some somatic symptoms but no objective findings. Lungs are clear blood pressure stable. No active nausea or vomiting. Patient was given prednisone and Benadryl and Pepcid with improvement. Discharge home with prednisone.    Final Clinical Impressions(s) / ED Diagnoses     Final diagnoses:   Allergic reaction, initial encounter     New Prescriptions  Current Discharge Medication List     START taking these medications    Details   predniSONE (DELTASONE) 20 MG tablet 2 tabs po daily x 4 days  Qty: 8 tablet, Refills: 0           [name], DO  08/07/15 [id number]    Electronically signed by [name], DO at 08/07/2015  6:41 AM EDT

## 2015-08-07 NOTE — ED Notes (Signed)
Pt improved  Swelling of tongue better  Rash has gone

## 2015-08-07 NOTE — ED Provider Notes (Signed)
[facility]-EMERGENCY DEPT Provider Note   CSN: [id number] Arrival date & time: 08/07/15  [id number]  First Provider Contact:  4:00  History   Chief Complaint No chief complaint on file.       The history is provided by the patient. No language interpreter was used.   HPI Comments: Courtney Rhodes is a 50 y.o. female with a PMHx of HTN, GERD, depression, obesity, and migraines, who presents to the Emergency Department complaining of gradual onset, intermittent episodes of pruritic areas to her bilateral upper extremities, palms of her hands, and head onset PTA. She notes that she has associated tongue swelling. Pt reports that she was styling her hair this morning ~18 hours PTA at the onset of her symptoms. She notes that she uses a lot of different hair products, but denies using any type of new product.No new soaps, lotions, detergents, foods, animals, plants, or medications. She denies SOB, trouble breathing, or any other symptoms.  Past Medical History:  Diagnosis Date  . Depression   . GERD (gastroesophageal reflux disease)   . Heart murmur   . Hypertension   . Migraines   . Obesity     Patient Active Problem List   Diagnosis Date Noted  . Diastolic dysfunction 01/20/2012  . Chest pain 01/19/2012  . Hypertension 01/19/2012  . GERD (gastroesophageal reflux disease) 01/19/2012    Past Surgical History:  Procedure Laterality Date  . CHOLECYSTECTOMY    . WISDOM TOOTH EXTRACTION      OB History    No data available       Home Medications    Prior to Admission medications   Medication Sig Start Date End Date Taking? Authorizing Provider  amLODipine (NORVASC) 10 MG tablet Take 10 mg by mouth daily.    Historical Provider, MD  aspirin EC 81 MG tablet Take 81 mg by mouth daily.    Historical Provider, MD  butalbital-acetaminophen-caffeine (FIORICET, ESGIC) 50-325-40 MG per tablet Take 1 tablet by mouth every 6 (six) hours as needed for headache. 15 tablets must last 30 days     Historical Provider, MD  butorphanol (STADOL) 10 MG/ML nasal spray Place 1 spray into the nose every 4 (four) hours as needed. pain    Historical Provider, MD  ciprofloxacin (CIPRO) 500 MG tablet Take 500 mg by mouth 2 (two) times daily.    Historical Provider, MD  cloNIDine (CATAPRES) 0.3 MG tablet Take 0.3 mg by mouth 2 (two) times daily.    Historical Provider, MD  cyclobenzaprine (FLEXERIL) 10 MG tablet Take 10 mg by mouth at bedtime.    Historical Provider, MD  docusate sodium (COLACE) 100 MG capsule Take 100 mg by mouth 2 (two) times daily.    Historical Provider, MD  etodolac (LODINE) 500 MG tablet Take 500 mg by mouth 2 (two) times daily.    Historical Provider, MD  FLUoxetine (PROZAC) 40 MG capsule Take 40 mg by mouth 2 (two) times daily.     Historical Provider, MD  folic acid (FOLVITE) 1 MG tablet Take 1 mg by mouth daily.    Historical Provider, MD  furosemide (LASIX) 40 MG tablet Take 40 mg by mouth daily.    Historical Provider, MD  gabapentin (NEURONTIN) 300 MG capsule Take 300 mg by mouth 3 (three) times daily.    Historical Provider, MD  hydrALAZINE (APRESOLINE) 10 MG tablet Take 10 mg by mouth 3 (three) times daily.    Historical Provider, MD  hydrALAZINE (APRESOLINE) 50 MG tablet Take 50  mg by mouth 3 (three) times daily.    Historical Provider, MD  losartan (COZAAR) 100 MG tablet Take 1 tablet (100 mg total) by mouth daily. 01/20/12   Meredeth Ide, MD  metoprolol succinate (TOPROL-XL) 50 MG 24 hr tablet Take 50 mg by mouth daily. Take with or immediately following a meal.    Historical Provider, MD  mirtazapine (REMERON) 15 MG tablet Take 15 mg by mouth at bedtime.    Historical Provider, MD  Multiple Vitamin (MULTIVITAMIN WITH MINERALS) TABS Take 1 tablet by mouth daily.    Historical Provider, MD  nystatin (MYCOSTATIN) 100000 UNIT/ML suspension Take 5 mLs by mouth 4 (four) times daily.    Historical Provider, MD  omeprazole (PRILOSEC) 20 MG capsule Take 20 mg by mouth daily.     Historical Provider, MD  ondansetron (ZOFRAN ODT) 4 MG disintegrating tablet Take 1 tablet (4 mg total) by mouth every 8 (eight) hours as needed for nausea. 01/15/13   Eber Hong, MD  oxyCODONE-acetaminophen (PERCOCET/ROXICET) 5-325 MG per tablet Take 2 tablets by mouth every 4 (four) hours as needed for severe pain. 01/15/13   Eber Hong, MD  potassium chloride SA (K-DUR,KLOR-CON) 20 MEQ tablet Take 20 mEq by mouth 2 (two) times daily.    Historical Provider, MD  pramipexole (MIRAPEX) 0.125 MG tablet Take 0.125 mg by mouth at bedtime.     Historical Provider, MD  predniSONE (DELTASONE) 20 MG tablet 2 tabs po daily x 4 days 08/07/15   Melene Plan, DO  promethazine (PHENERGAN) 25 MG tablet Take 25 mg by mouth every 6 (six) hours as needed for nausea or vomiting.    Historical Provider, MD  promethazine (PHENERGAN) 25 MG tablet Take 1 tablet (25 mg total) by mouth every 6 (six) hours as needed for nausea or vomiting. 01/15/13   Eber Hong, MD  ranitidine (ZANTAC) 300 MG tablet Take 300 mg by mouth at bedtime.    Historical Provider, MD  sucralfate (CARAFATE) 1 GM/10ML suspension Take 1 g by mouth 4 (four) times daily.    Historical Provider, MD  Vitamin D, Ergocalciferol, (DRISDOL) 50000 UNITS CAPS Take 50,000 Units by mouth every 7 (seven) days.     Historical Provider, MD  zolmitriptan (ZOMIG-ZMT) 5 MG disintegrating tablet Take 5 mg by mouth as needed for migraine.    Historical Provider, MD    Family History No family history on file.  Social History Social History  Substance Use Topics  . Smoking status: Current Every Day Smoker    Packs/day: 1.00    Years: 5.00    Types: Cigarettes  . Smokeless tobacco: Not on file  . Alcohol use Yes     Comment: occasional     Allergies   Ace inhibitors   Review of Systems Review of Systems  Constitutional: Negative for chills and fever.  HENT: Negative for congestion, rhinorrhea and trouble swallowing.        Positive for tongue swelling.    Eyes: Negative for redness and visual disturbance.  Respiratory: Negative for shortness of breath and wheezing.   Cardiovascular: Negative for chest pain and palpitations.  Gastrointestinal: Negative for nausea and vomiting.  Genitourinary: Negative for dysuria and urgency.  Musculoskeletal: Negative for arthralgias and myalgias.  Skin: Negative for pallor and wound.  Neurological: Negative for dizziness and headaches.  All other systems reviewed and are negative.   Physical Exam Updated Vital Signs BP 130/81   Pulse 81   Temp 103 F (39.4 C) (Oral)  Resp 12   Ht 5\' 3"  (1.6 m)   Wt 165 lb (74.8 kg)   SpO2 90%   BMI 29.23 kg/m   Physical Exam  Constitutional: She is oriented to person, place, and time. She appears well-developed and well-nourished.  HENT:  Head: Normocephalic and atraumatic.  No appreciable tongue swelling.  Eyes: EOM are normal.  Neck: Normal range of motion.  Cardiovascular: Normal rate, regular rhythm and normal heart sounds.   No murmur heard. Pulmonary/Chest: Effort normal. No respiratory distress. She has no wheezes. She has no rales.  Abdominal: Soft.  Musculoskeletal: Normal range of motion.  Neurological: She is alert and oriented to person, place, and time.  Skin: Skin is warm and dry. Capillary refill takes less than 2 seconds. No rash noted. No erythema.  Psychiatric: She has a normal mood and affect.  Nursing note and vitals reviewed.  ED Treatments / Results  Labs (all labs ordered are listed, but only abnormal results are displayed) Labs Reviewed - No data to display  EKG  EKG Interpretation None       Radiology No results found.  Procedures Procedures (including critical care time)  Medications Ordered in ED Medications - No data to display   Initial Impression / Assessment and Plan / ED Course  I have reviewed the triage vital signs and the nursing notes.  Pertinent labs & imaging results that were available during  my care of the patient were reviewed by me and considered in my medical decision making (see chart for details).  Clinical Course    50 yo F With a chief complaint of a possible allergic reaction. Patient with some somatic symptoms but no objective findings. Lungs are clear blood pressure stable. No active nausea or vomiting. Patient was given prednisone and Benadryl and Pepcid with improvement. Discharge home with prednisone.  Final Clinical Impressions(s) / ED Diagnoses   Final diagnoses:  Allergic reaction, initial encounter    New Prescriptions Current Discharge Medication List    START taking these medications   Details  predniSONE (DELTASONE) 20 MG tablet 2 tabs po daily x 4 days Qty: 8 tablet, Refills: 0         Melene Plan, DO 08/07/15 [phone]

## 2015-08-07 NOTE — ED Notes (Signed)
Formatting of this note might be different from the original.  Pt improved  Swelling of tongue better  Rash has gone  Electronically signed by Cyndia Skeeters, RN at 08/07/2015  6:24 AM EDT

## 2015-08-08 LAB — I-STAT CHEM 8, ED
BUN: 12 mg/dL (ref 6–20)
CHLORIDE: 101 mmol/L (ref 101–111)
Calcium, Ion: 1.18 mmol/L (ref 1.13–1.30)
Creatinine, Ser: 0.8 mg/dL (ref 0.44–1.00)
Glucose, Bld: 122 mg/dL — ABNORMAL HIGH (ref 65–99)
HCT: 37 % (ref 36.0–46.0)
Hemoglobin: 12.6 g/dL (ref 12.0–15.0)
POTASSIUM: 4 mmol/L (ref 3.5–5.1)
Sodium: 140 mmol/L (ref 135–145)
TCO2: 32 mmol/L (ref 0–100)

## 2016-07-21 NOTE — Progress Notes (Signed)
Formatting of this note might be different from the original.  Patient has not followed up in this office since 4/17.lasix,aldactone,losartan medication not prescribed from this office in the last 6 months or refills would have run out more than 6 months ago if taken as prescribed. This medicine will be removed from active med list.   If patient confirms they are actively taking this medication or request refills on this medication, please advise them to make a follow up appointment and lab work. Please route the request to me as well.    Lanier Clam, MD      Electronically signed by Lanier Clam, MD at 07/21/2016  7:35 AM EDT

## 2017-10-17 NOTE — ED Notes (Signed)
Formatting of this note might be different from the original.  Patient with multiple complaints for a week. Saw her PC on Monday and was told it was related to her not wearing her Bipap at night. Patient states she has started wearing it but nothing has gotten better  Electronically signed by Pflueger, Libby Maw, RN at 10/17/2017 10:55 PM EDT

## 2017-10-17 NOTE — ED Triage Notes (Signed)
Formatting of this note might be different from the original.  Pt presents to ED for htn in the 200s at home for the past week. Pt states she was newly dx with CHF and was given htn meds, lasix 40mg , and CPAP to wear at home and for the past week, it has not been helping. Pt states she went to her PMD and states she was told her htn was r/t not using CPAP at home, however states she has been using it. Pt also c/o numbness on her right thumb that radiates up her arm, states it comes and goes. RACE score is 0. Pt also states she has intermittent CP and pressure that goes as high as an 8/10 that radiates straight through her back. Pt is unsure if anything makes it worse or better because when she has the pain she doesn't move.   Electronically signed by Hayes Ludwig, RN at 10/17/2017  8:46 PM EDT

## 2017-10-17 NOTE — ED Provider Notes (Signed)
Formatting of this note might be different from the original.  Patient placed in [place], seen and evaluated for chief complaint of HTN and chest pain.  Pertinent H&P findings include 52 y.o. female with h/o HTN, CHF, and OSA on CPAP. The patient repots chest pain and pressure, intermittent X1 week. Compliant with 40mg  Lasix (new CHF diagnosis per patient). Also intermittent R thumb decreased sensation for the past 1 week. She also states that her partner tested positive for several STDs and she would like testing/treatment. PE -- bradycardia, no acute respiratory distress, pitting edema to lower extremities, PERLA EOMs intact, symmetric facial movements, 5/5 grip strength, clear speech, normal gait. Based on initial evaluation, labs are indicated and radiology studies are indicated.  Patient counseled on process, plan, and necessity for staying for completing the evaluation.      BP 157/71   Pulse 56   Temp 97.8 F (36.6 C) (Oral)   Resp 18   Ht 1.575 m (5\' 2" )   Wt 125.2 kg (276 lb)   SpO2 96%   BMI 50.48 kg/m       Electronically signed by: Lezlie Lye, PA-C  10/18/17 [phone]    Electronically signed by Lezlie Lye, PA-C at 10/18/2017  9:04 AM EDT

## 2017-10-17 NOTE — ED Notes (Signed)
Formatting of this note might be different from the original.  Also states her partner was diagnosed with some STDs. Wants testing for this, does not want it discussed in front of her daughter, who is at the bedside  Electronically signed by Pflueger, Libby Maw, RN at 10/17/2017 11:03 PM EDT

## 2017-10-17 NOTE — ED Provider Notes (Signed)
Formatting of this note is different from the original.    History     Chief Complaint   Patient presents with   ? Chest Pain   ? Hypertension   ? Numbness     HPI   Erica Oconnor is a 52 year old female with past medical history of hypertension, CHF, OSA on CPAP at night who presents the ED with concern for hypertension.  Patient reports that she has had poor control of her blood pressure for the last 2 months.  She states in the mornings her pressure seems okay but at night it goes up with systolics into the 578I.  She reports has been taking her medications as prescribed.  Patient states she has been seeing her regular doctor in [place] but she wants a second opinion.  Patient states she was hospitalized in August during which time she had a stress test that was negative.  She reports some recent medication changes including recent diagnosis of CHF and was started on Lasix.  Patient reports since Saturday she has had intermittent chest pain in the middle of her chest.  She describes the pain as a pressure.  She reports at times it felt as if it went to her back but denies any current pain.  She states it comes and goes at different times.  Patient states the most recent time she had chest pain was yesterday afternoon.  Patient currently has no chest pain.  She says at times she has felt short of breath but she thinks that is due to her anxiety.  Patient denies any shortness of breath particularly with walking.  She does state that she had some fluid on her ankles yesterday but it has since resolved.  Patient is also concern for tingling in her tip of her right thumb for 1 week.  She states at one point today it felt like the tingling had went up her arm but that has now resolved.  Patient denies any injuries to her thumb.  She denies any headache, pain with urination, abdominal pain, nausea, or vomiting.  Patient denies any fever or chills.    Past Medical History:   Diagnosis Date   ? CHF (congestive  heart failure) ([place])    ? Hypertension      No past surgical history on file.    No family history on file.    Social History     Tobacco Use   ? Smoking status: Not on file   Substance Use Topics   ? Alcohol use: Not on file   ? Drug use: Not on file     Review of Systems   Constitutional: Negative for chills, diaphoresis, fatigue and fever.   HENT: Negative for congestion, sneezing and sore throat.    Eyes: Negative for photophobia and visual disturbance.   Respiratory: Positive for shortness of breath. Negative for cough, choking, chest tightness and wheezing.    Cardiovascular: Positive for chest pain. Negative for palpitations and leg swelling.   Gastrointestinal: Negative for abdominal pain, constipation, diarrhea, nausea and vomiting.   Genitourinary: Negative for difficulty urinating and dysuria.   Musculoskeletal: Negative for arthralgias and back pain.   Skin: Negative for pallor and rash.   Neurological: Negative for dizziness, seizures, syncope, weakness, light-headedness and headaches.   Psychiatric/Behavioral: Negative for agitation, behavioral problems and confusion.   All other systems reviewed and are negative.    Physical Exam     ED Triage Vitals [10/17/17 2040]   BP  157/71   MAP (mmHg) 92   Pulse 56   Resp 18   Temp 97.8 F (36.6 C)   SpO2 96 %   Weight 125.2 kg (276 lb)     Physical Exam  Vitals signs and nursing note reviewed.   Constitutional:       General: She is not in acute distress.     Appearance: She is well-developed.   HENT:      Head: Normocephalic and atraumatic.   Eyes:      Conjunctiva/sclera: Conjunctivae normal.      Pupils: Pupils are equal, round, and reactive to light.   Neck:      Musculoskeletal: Normal range of motion and neck supple.   Cardiovascular:      Rate and Rhythm: Normal rate and regular rhythm.   Pulmonary:      Effort: Pulmonary effort is normal. No respiratory distress.      Breath sounds: Normal breath sounds. No wheezing.   Chest:      Chest wall: No  tenderness.   Abdominal:      General: Bowel sounds are normal. There is no distension.      Palpations: Abdomen is soft.      Tenderness: There is no tenderness. There is no guarding.   Musculoskeletal: Normal range of motion.         General: No deformity.      Comments: Tingling of R distal thumb, sensation intact, FROM, 2+ bilateral radial pulses    Skin:     General: Skin is warm and dry.      Capillary Refill: Capillary refill takes less than 2 seconds.   Neurological:      General: No focal deficit present.      Mental Status: She is alert and oriented to person, place, and time.      Motor: No weakness.   Psychiatric:         Behavior: Behavior normal.     ED Course   Procedures        MDM  Ms. Appenzeller is a 52 year old female with past medical history of hypertension, CHF, OSA on CPAP at night who presents the ED with concern for hypertension. Afebrile, HDS, hypertensive. HR 50-60s.     Patient is overall well-appearing.  Heart rate in the 50s to 60s on monitor, she states she is not sure her typical pulses.  Patient states she is currently asymptomatic and not expensing any chest pain.  Her most recent episode of chest pain was yesterday afternoon.  Patient states she does still have tingling of her right distal thumb.  She denies any numbness or injury.  She says that time she had shortness of breath but denies any current difficulty breathing.    Considered: ACS, PE, pneumonia, CHF exacerbation, hypertensive urgency   Other patient describes chest pain as shooting to her back when it is present, she is currently chest pain-free, no pulse deficit.  She states that the tingling in her right thumb started 1 week ago and this was prior to when she first started to experience some chest pain.  No wide mediastinum on chest x-ray.  Doubt aortic dissection.    EKG shows sinus bradycardia with no obvious ischemic findings  Chest x-ray unremarkable    No leukocytosis  Cr 0.93    Patient is a hear score 4, delta  troponin negative.  Currently chest pain-free.  Requested outside records from [facility].  These records show a nuclear  med study performed in August that was found to be negative for any acute ischemic findings.    Patient has missed her evening home medications.  Patient was given her home hydralazine as well as clonidine.  She responded well with repeat blood pressures with systolics in the 098J.  Of note, reviewing outside records there was some concern for medication noncompliance.  Patient is currently asymptomatic, doubt hypertensive urgency/emergency.  No crackles or leg edema to suggest CHF exacerbation, BNP mildly elevated at 188.    For multiple reassessments patient continues to be well-appearing.  We discussed follow-up with her regular doctor regarding blood pressure control.  It may be that she is experiencing stable angina at times although less likely.  Patient has not been hypoxic, tachycardic, or in any respiratory distress to suggest PE.  Stable for discharge at this time.  We discussed that she could up her evening clonidine 2.3 mg as needed if blood pressure continues to remain poorly controlled after taking her regular medications but also encouraged her to discuss this with her regular doctor.    Strict return precautions given.  The plan for this patient was discussed with Dr. Nicolette Bang, who voiced agreement and who oversaw evaluation and treatment of this patient.    Clinical Impression:   1. Chest pain in adult    2. Hypertension, unspecified type      ED Disposition     ED Disposition Condition Comment    Discharge  Erica Oconnor discharge to home/self care.            Electronically signed by: Genevie Ann, MD  Resident  10/18/17 [phone]      Electronically signed by: Lorre Munroe, MD  10/18/17 1339    Electronically signed by Lorre Munroe, MD at 10/18/2017  1:39 PM EDT    Associated attestation - Lorre Munroe, MD - 10/18/2017  1:39 PM  EDT  Formatting of this note might be different from the original.  Seen and examined w/ me. Case discussed with resident. I agree with resident's note and plan of care.    Patient with some atypical CP in recent past but none in 24 hours preceeding ED visit. Had recent nuc-med test. We got copies from [facility], it was negative. With that, I felt delta troponins was appropriate.    Her PERC was negative. Her BP came down with her evening meds. But did not come down to acceptable levels. We advised increasing PM dose of catapress and outpatient f/u.

## 2019-07-17 NOTE — Addendum Note (Signed)
Addended by: Erin Sons A on: 07/28/2019 04:55 PM     Modules accepted: Orders      Electronically signed by Sherie Don, MD at 07/28/2019  4:55 PM EDT

## 2019-07-17 NOTE — Progress Notes (Signed)
Formatting of this note is different from the original.  CC: Eye Problem (Pt presents for evaluation re: Disc Edema and elevated IOP referred by Optom, Pt states she has blurred vision OU. PT denies any pain  OU, but still experiences ringing in ears that causes headaches.  Pt denies any flashes of light or floaters. )    History of Present Illness:      Erica Oconnor is a 54 y.o. female with previous ophthalmic history as below presenting for evaluation of IIH. Had chronic headaches for years as well as pulsatile tinnitus. No TVOs. Denies doxycycline or prednisone use.    Had blurry vision on waking up the week of Juneteenth. Saw an optometrist who diagnosed disc edema and referred for scans. Had negative MRI in ED followed by LP showing pressure in low 50s. 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    Gtts/Ung:  - none    POH:    Disc edema diagnosed by outside optom    LEE:  Friday, July 03, 2019 with optometrist     ROS: Pertinent ROS noted in HPI and all other systems are negative    FHx:   No significant family ophthalmic history     Past medical history, medications, family history, social history, and allergies all reviewed and updated appropriately.    Examination:      Exam findings as noted in exam section in [facility].    Diagnostic Testing:      HVF 24-2 SITA Fast: Study date: (7//2021)  OD: good reliability, FP 0%, FN 1%. Foveal threshold 36 dB. There is mild diffuse depression. MD: -3.22 db. There are nonspecific points of depression on pattern deviation; these points of depression more or less correspond with the total deviation plot as well.   OS: good reliability, FP 2%, FN 4%. Foveal threshold 38 dB. There is mild diffuse depression. MD: -1.94 db. There are nonspecific points of depression on pattern deviation; these points of depression more or less correspond with the total deviation plot as well.     OCT Mac (07/17/2019):  OD: CMT 287, normal foveal contour, EZ intact, no IRF/SRF  OS: CMT 286, normal foveal  contour, EZ intact, no IRF/SRF    OCT RNFL: Study date: (07/17/2019):  OD: SS 8/10, average RNFL thickness 139 microns, superior / nasal / inferior sectoral RNFL edema/thickening.   OS: SS 7/10, average RNFL thickness 238 microns, diffuse RNFL edema/thickening.     Ganglion Cell Analysis (07/17/2019)  OD: Avg GCL + IPL thickness 82; Min GCL + IPL thickness 82; no thinning  OS: Avg GCL + IPL thickness 81; Min GCL + IPL thickness 81; no thinning    Assessment/Plan:      Idiopathic intracranial hypertension  - chronic headaches x several years with pulsatile tinnitus   - not on any high risk meds (doxycyline, prednisone, OCPs)  - came to ED 07/14/2019, MRI/MRV negative except for stigmata of IIH; LP under fluoroscopy was low 50s  - patient was started on Diamox (now 250 mg BID) with improvement in headaches  - educated patient that weight loss can be helpful  - f/u with neurology   - return 3 months for repeat fields and OCT    Marland Kitchen D. Hart Carwin, MD  PGY-4  [employer]  Department of Ophthalmology     Seen with Dr. Malachi Paradise     Disposition:  - follow-up 3 months for HVF 30-2 OU and OCT [place] RNFL / Mac / GCA (no dilation)  Electronically signed by Hart Carwin, MD at 07/18/2019 12:19 PM EDT

## 2019-07-17 NOTE — Progress Notes (Signed)
Formatting of this note might be different from the original.  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 I have reviewed the provider's history, physical exam, assessment and treatment plans. I concur with or have edited all elements of the provider's note.  I have personally reviewed the test data/image/tracing and I agree with the documented findings and interpretation.  I have edited the report as appropriate.    Sherie Don, MD      AVF 24-2 SITA Fast: Study date: (07/17/2019)  OD: Good reliability. Foveal threshold 36 dB. There is mild diffuse depression. MD: -3.22 db. There are nonspecific points of depression on pattern deviation; these points of depression more or less correspond with the total deviation plot as well.   OS: Good reliability. Foveal threshold 38 dB. There is mild diffuse depression. MD: -1.94 db. There are nonspecific points of depression on pattern deviation; these points of depression more or less correspond with the total deviation plot as well.     OCT Mac (07/17/2019):  OD: CMT 287, normal foveal contour, EZ intact, no IRF/SRF  OS: CMT 286, normal foveal contour, EZ intact, no IRF/SRF    OCT RNFL: Study date: (07/17/2019):  OD: SS 8/10, average RNFL thickness 139 microns, superior / nasal / inferior sectoral RNFL edema/thickening.   OS: SS 7/10, average RNFL thickness 238 microns, diffuse RNFL edema/thickening.     Ganglion Cell Analysis (07/17/2019)  OD: Avg GCL + IPL thickness 82; Min GCL + IPL thickness 82; no thinning  OS: Avg GCL + IPL thickness 81; Min GCL + IPL thickness 81; no thinning  Electronically signed by Sherie Don, MD at 07/28/2019  4:52 PM EDT

## 2020-12-14 NOTE — Progress Notes (Signed)
Formatting of this note might be different from the original.    Self Swab Type: Anterior Nasal    Time of patient swab  Electronically signed by Jodean Lima at 12/14/2020  3:38 PM EST

## 2021-10-20 NOTE — Progress Notes (Signed)
Formatting of this note is different from the original.  [facility] Progress Note  Patient Name:  Erica Oconnor  MRN: [id number]  Date/Time Completed: 4:31 PM  10/20/2021                               Primary Care Physician:  Lottie Dawson, MD  -------------------------------------------------------------------------------------------------------------------  ASSESSMENT & PLAN:     Erica Oconnor is a 56 y.o. female who presents today for:    1. Vaginal mass- on vaginal exam today a large firm vascular mass was identified. Unable to fully size and not able to easily locate cervix given location of mass. Very concerning and warrants further workup with differential wide at this time but inclusive of malignancy. I discussed my workup and concerns with patient. Placed order for CT with team plan to facilitate scheduling within next 1-2 weeks  - CT abdomen pelvis w and wo IV contrast; Future  - Comprehensive metabolic panel; Future  - CBC and differential; Future    2. Dysfunctional uterine bleeding- given above identified concern and difficulty locating cervix, we did not remove IUD today but will need to be planned for within context of above workup    Orders Placed This Encounter   Procedures   ? CT abdomen pelvis w and wo IV contrast     Standing Status:   Future     Standing Expiration Date:   10/21/2022     Order Specific Question:   Does the patient have abnormal renal function?     Answer:   No     Order Specific Question:   Is perforation suspected?     Answer:   No     Order Specific Question:   Reason for exam:     Answer:   mass on vaginal exam     Order Specific Question:   Is the patient pregnant?     Answer:   No     Order Specific Question:   Does the patient need anesthesia?     Answer:   No     Order Specific Question:   Release to Patient     Answer:   Immediate   ? Comprehensive metabolic panel     Standing Status:   Future     Standing Expiration Date:   10/21/2022   ? CBC and differential      Standing Status:   Future     Standing Expiration Date:   10/21/2022   ? HIV-1/2 Antigen-Antibody     Standing Status:   Future     Standing Expiration Date:   10/21/2022   ? Syphilis Total Antibody     Standing Status:   Future     Standing Expiration Date:   10/21/2022       Follow up for within 1 week of 10/11 CT scan.    Lottie Dawson,  MD PHD  [facility]  ---------------------------------------------------------------------------------------------------------------------  SUBJECTIVE:   CC:    Chief Complaint   Patient presents with   ? Gynecologic Exam     History of Present Illness:    Erica Oconnor is a 56 y.o. female here for the following issues:    Dysfunctional uterine bleeding  - hormonal IUD in place > 5 years  - [facility] since last visit indicates in postmenopausal range  - here today for IUD removal  - due for PAP, no history  of abnormal    Review of Systems:  As stated above in HPI    Home Medication List:    Current Outpatient Medications   Medication Instructions   ? amLODIPine (NORVASC) 10 mg, Oral, Daily   ? butalbital-acetaminophen-caffeine (Fioricet) 50-325-40 MG tablet 1 tablet, Oral   ? butorphanol (Stadol) 10 MG/ML nasal spray 1 SPRAY IN ONE NOSTRIL AS NEEDED NASALLY EVERY 6 HRS 30 DAYS   ? chlorhexidine (Peridex) 0.12 % solution RINSE MOUTH WITH 15ML (1 CAPFUL) FOR 30 SECONDS IN MORNING AND EVENING AFTER BRUSHING, THEN SPIT   ? Cholecalciferol (Vitamin D) 50 MCG (2000 UT) capsule Vitamin D   ? cloNIDine (Catapres) 0.1 MG tablet TAKE 1 TABLET BY MOUTH EVERY DAY FOR 30 DAYS   ? [facility] Aspirin Adult Low Dose 81 MG chewable tablet TAKE 1 TABLET BY MOUTH EVERY DAY FOR 30 DAYS   ? [facility] Vitamin C-Rose Hips 1,000 mg, Oral, Daily   ? diclofenac (Voltaren) 1% topical gel APPLY AS DIRECTED FOUR TIMES A DAY EXTERNALLY   ? docusate sodium (COLACE) 100 mg, Oral, 2 times daily   ? ergocalciferol 1.25 MG (50000 UT) capsule TAKE 1 CAPSULE EVERY WEEK BY ORAL ROUTE.   ? etodolac XL (LODINE XL) 400 mg,  Oral, Daily   ? fluocinolone (DermOtic) 0.01 % ear drops INSTILL 5 DROP(S) INTO AFFECTED EAR(S) EVERY DAY   ? FLUoxetine (PROZAC) 40 mg, Oral, 2 times daily   ? fluticasone (Flonase) 50 MCG/ACT nasal spray SPRAY 1 SPRAY INTO INTO EACH NOSTRIL EVERY DAY   ? folic acid (Folvite) 1 MG tablet TAKE 1 TABLET BY MOUTH EVERY DAY FOR 30 DAYS   ? furosemide (LASIX) 40 mg, Oral, Daily   ? gabapentin (Neurontin) 300 MG capsule TAKE 1 CAPSULE BY MOUTH IN THE MORNING AND 2 CAPSULES IN THE EVENING   ? guanFACINE (INTUNIV) 2 mg, Oral, Daily   ? guanFACINE (TENEX) 2 mg, Oral   ? hydrALAZINE (APRESOLINE) 100 mg, Oral, 3 times daily   ? irbesartan (AVAPRO) 300 mg, Oral, Daily   ? IRON, FERROUS SULFATE, PO 1 tablet, Oral, Daily   ? metoprolol tartrate (Lopressor) 50 MG tablet TAKE 1 TABLET BY MOUTH TWICE A DAY WITH FOOD FOR 90 DAYS   ? naproxen (Naprosyn) 500 MG tablet TAKE 1 TABLET BY MOUTH EVERY 12 HOURS WITH FOOD OR MILK AS NEEDED   ? omeprazole (PriLOSEC) 20 MG DR capsule TAKE 1 CAPSULE BY MOUTH EVERY DAY 30 MINUTES BEFORE MORNING MEAL   ? omeprazole (PRILOSEC) 40 mg, Oral, Daily before breakfast, Do not crush or chew.   ? predniSONE (Deltasone) 20 MG tablet PLEASE SEE ATTACHED FOR DETAILED DIRECTIONS   ? rosuvastatin (Crestor) 10 MG tablet TAKE 1 TABLET BY MOUTH EVERY DAY FOR 30 DAYS   ? spironolactone (ALDACTONE) 50 mg, Oral, 2 times daily   ? zolpidem (Ambien) 10 MG tablet TAKE 1 TABLET BY MOUTH EVERY DAY AT BEDTIME AS NEEDED FOR 30 DAYS     -------------------------------------------------------------------------------------------------------------------  OBJECTIVE:   Vital Signs:  Visit Vitals  BP 104/62 (BP Location: Right arm, Patient Position: Sitting, BP Cuff Size: Adult)   Pulse 67   Ht 1.719 m   Wt 120.4 kg   BMI 40.76 kg/m   Smoking Status Never   BSA 2.4 m     Estimated body mass index is 40.76 kg/m as calculated from the following:    Height as of this encounter: 1.719 m.    Weight as of this encounter: 120.4  kg.    CONSTITUTIONAL: Well-developed female in no acute distress  HEENT: Normocephalic/atraumatic. EOM intact.   CARDIO: Normal rate, regular rhythm  PULM:  nnormal effort, breathing unlabored  GYN: normal external genitalia without lesions, vaginal exam initially with speculum identifies large firm mass with easily visible vasculature on anterior vaginal wall, large, changed to manual exam and firm lesion without ability to easily locate cervix, non tender, difficulty to assess mobility given size  NEURO: Alert, follows commands, answers questions appropriately, no gross focal deficits  PSYCH: appropriate mood and affect    -------------------------------------------------------------------------------------------------------------------    Electronically signed by Lottie Dawson, MD at 10/20/2021  4:31 PM EDT

## 2021-11-01 NOTE — Unmapped (Signed)
Formatting of this note might be different from the original.  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 States they found a growth during her pap smear which could be contributing to her leg pain according to the MD.    Objective     Pt minor antalgic gait on R LE    Treatments    Therapeutic Exercises (97110) to develop strength, endurance, range of motion, flexibility  -Indirect time 8 min     -strap gastroc stretch R 2x30"     -Supine HF/quad st 30x"     -Ankle 4-way G 20x       Therapeutic Activity (97530) for dynamic activities, functional performance, power or agility  -Direct time 20 min     -quad sets 15x5"     -bridges 15x5"     -strap gastroc stretch R 2x30"     -HR/TR 15x ea, pain with HR     -B SLS 5x10" ea     -NuStep x 6'     -Mini squats 20x     -Lat step ups 2x10     -Rockerboard 30 Fwd       Neuromuscular Re-Education (83151) for movement, balance, coordination, kinesthetic sense, posture, balance for sitting and/or standing activities  -Direct time: 10 min     -SLR 2x10     -SAQ 2x10     -DKTC with SWB 15x5"     -Stand hip abd/ext 3# 20x     -indirect Time: 5 min       Manual Therapy (76160) for joint mobilization/manipulation, soft tissue mobilization  -retro-edema massage and STM R knee and R achilles     -patellar mobs     -PROM R ankle DF/PF with OP as tolerated     -Direct time: 12 min       The therapy interventions in this note were performed separately and at distinct intervals of time.    Assessment   Pt with good tol to treatment this date. Pt with dec pain today and demos dec tightness in BL LE. Pt  able to perform new strengthening exercises with no inc in pain but demos minor difficulty with rockerboard due to dec eccentric control. Pt would continue to benefit from skilled PT to address remaining impairments with return to PLOF    Plan     Continue to progress pt in accordance with POC.      Electronically signed by Stana Bunting, PT, DPT at 11/01/2021 12:19 PM EDT    Associated attestation - Stana Bunting, PT, DPT - 11/01/2021 12:19 PM EDT  Formatting of this note might be different from the original.  I, Stana Bunting, PT, DPT, co-sign as the primary therapist agree with the treatment provided.

## 2021-11-05 ENCOUNTER — Encounter

## 2021-11-08 NOTE — Addendum Note (Signed)
Addended by: Dorian Pod on: 11/09/2021 12:14 AM     Modules accepted: Orders      Electronically signed by Dorian Pod, MD at 11/09/2021 12:14 AM EDT

## 2021-11-08 NOTE — Progress Notes (Signed)
Formatting of this note is different from the original.  Problem Visit Progress Note  Erica Oconnor is a 56 y.o. female.  Chief Complaint   Patient presents with   ? Fibroids     1. Uterine mass (Primary)  2. Uterine leiomyoma, unspecified locatio  3. Malpositioned intrauterine device (IUD), initial encounter    Discussed with patient her concerns of the uterine mass  Discussed it is likely a fibroid given her clinical hisroy but did discuss the possibility of underlying malignancy(no suspicious adenopathy reported on CT scan)  Discussed that it may be contributing to her pain  I discussed the recommendation for surgical management in the form of a hysterectomy  Understand patient's concerns/hesistancy given mistrust of health care system, tried to provide reassurance  Discussed alternative options of expectant management, myomectomy(not recommended though),   I do not think given prolapsing mass she would be a candidate for UFE thus I did not discuss  We discussed biopsy of the mass for definitve diagnosis especially if surgical management is not desired but she did not desire at this time but will consider  I discussed obtaining an TVUS for further characterization of uterus, mass and gynecological structures  Patient reports already being scheduled for an MR of her head and will see if MRI of her pelvis can be done as well)order placed    Unable to remove IUD in office(suspect due to prolapsing uterine mass)  Cervical cancer screening not done due to mass an inability to fully visualized cervix    We did have an extensive conversation about hysterectomy  Will send information in [facility]  Discussed given body habitus and size of uterus and mass the possibility of minimal invasive hysterectomy but would discuss surgical approach better after reviewing her additional images obtained.   Patient reports hx of keloids with her CCK. Keloids noted. Discussed referral to dermatology. Discussed possible injection of kenalog  at the time of her procedure to minimize keloid formation.    Discussed while we are obtaining further images to betetr characterize her mass that I would recommend that she obtain medical clearance from her cardiologist(CHF) or PCP(Sleep apnea; does not use CPAP)    Will forward notation to Dr. Glennon Mac so she is aware of our visit today  Patient will be scheduled to have TVUS on 11/.6  MRI is pending  Follow up with me on 11/14    -     Ambulatory referral to Obstetrics / Gynecology  -     US pelvis transvaginal; Future  -     MR pelvis soft tissue w and wo IV contrast; Future    On the date of the encounter, I spent minutes 60 in patient care with > 50% in counseling/ coordination of care as described here:     Contraceptives:  IUD    Subjective   Patient ID [id number]     Patient presents today for discussion of recent ultrasound and pelvic exam that was done with Dr. Glennon Mac    The patient mentioned that she has not been able to see a GYN provider in years. She states that she she lives in [place] and there two gynecology providers in the town retired. She states with the pandemic she did not see any providers.  As a result, she lost track of time and realized she had not been seen in many years.    She reports that she has a known history of fibroids  She was told in 1999 due  to her fibroids she would not have children but subsequently had 2 chilldren in 1999 and 2002. She states her last pregnancy was complicated by symptomatic fibroids but that she had vaginal deliveries.    She states that she had HMB but this was controlled with Mirena IUD. She has used IUD for the last 20 years and it has managed her symptoms.    She states due to medical mistrust of the system, and experiences that her mother and grandmother had with GYN providers she did not want to elect to have a hysterectomy in the past and elected for medical management of her fibroids.    She states she presented to Dr. Glennon Mac on 10/20/21 and was  noted at that time to have a large uterine mass that displaced her fibroid.  She states she has had some lower back pain and left lower abdominal pain but didn't initially think it was related to her fibroids.  She denies any vaginal bleeding. She denies any concerns with urination or bowel movements.    Patient  has a past surgical history that includes Cholecystectomy.    Patient  has a past medical history of Anemia, Anxiety, Arthritis, CHF (congestive heart failure) (CMS/HCC), Depression, Headache, Hypertension, Keloid, and Sleep apnea.    Patient  reports that she has never smoked. She has never used smokeless tobacco. She reports current alcohol use of about 2.0 standard drinks of alcohol per week. She reports current drug use. Drug: Marijuana.    Patient family history includes Arthritis in her mother; Cancer in her brother; Diabetes in her father; Heart disease in her father; Hypertension in her father and mother; Stroke in her maternal grandmother and paternal grandmother.    Current Outpatient Medications   Medication Sig Dispense Refill   ? amLODIPine (Norvasc) 10 MG tablet Take 10 mg by mouth daily.     ? butalbital-acetaminophen-caffeine (Fioricet) 50-325-40 MG tablet Take 1 tablet by mouth.     ? butorphanol (Stadol) 10 MG/ML nasal spray 1 SPRAY IN ONE NOSTRIL AS NEEDED NASALLY EVERY 6 HRS 30 DAYS     ? chlorhexidine (Peridex) 0.12 % solution RINSE MOUTH WITH 15ML (1 CAPFUL) FOR 30 SECONDS IN MORNING AND EVENING AFTER BRUSHING, THEN SPIT     ? Cholecalciferol (Vitamin D) 50 MCG (2000 UT) capsule Vitamin D     ? cloNIDine (Catapres) 0.1 MG tablet TAKE 1 TABLET BY MOUTH EVERY DAY FOR 30 DAYS     ? [facility] Aspirin Adult Low Dose 81 MG chewable tablet TAKE 1 TABLET BY MOUTH EVERY DAY FOR 30 DAYS     ? [facility] Vitamin C-Rose Hips 1000 MG tablet Take 1,000 mg by mouth daily.     ? diclofenac (Voltaren) 1% topical gel APPLY AS DIRECTED FOUR TIMES A DAY EXTERNALLY     ? docusate sodium (DSS) 100 MG capsule Take 100 mg  by mouth 2 times daily.     ? ergocalciferol 1.25 MG (50000 UT) capsule TAKE 1 CAPSULE EVERY WEEK BY ORAL ROUTE.     ? etodolac XL (Lodine XL) 400 MG 24 hr tablet Take 400 mg by mouth daily.     ? fluocinolone (DermOtic) 0.01 % ear drops INSTILL 5 DROP(S) INTO AFFECTED EAR(S) EVERY DAY     ? FLUoxetine (PROzac) 40 MG capsule Take 40 mg by mouth 2 times daily.     ? fluticasone (Flonase) 50 MCG/ACT nasal spray SPRAY 1 SPRAY INTO INTO EACH NOSTRIL EVERY DAY     ?  folic acid (Folvite) 1 MG tablet TAKE 1 TABLET BY MOUTH EVERY DAY FOR 30 DAYS     ? furosemide (Lasix) 40 MG tablet Take 40 mg by mouth daily.     ? gabapentin (Neurontin) 300 MG capsule TAKE 1 CAPSULE BY MOUTH IN THE MORNING AND 2 CAPSULES IN THE EVENING     ? guanFACINE (Intuniv) 2 mg 24 hr tablet Take 2 mg by mouth daily.     ? guanFACINE (Tenex) 2 MG tablet Take 2 mg by mouth.     ? hydrALAZINE (Apresoline) 100 MG tablet Take 100 mg by mouth 3 times a day.     ? irbesartan (Avapro) 300 MG tablet Take 300 mg by mouth daily.     ? IRON, FERROUS SULFATE, PO Take 1 tablet by mouth daily.     ? metoprolol tartrate (Lopressor) 50 MG tablet TAKE 1 TABLET BY MOUTH TWICE A DAY WITH FOOD FOR 90 DAYS     ? naproxen (Naprosyn) 500 MG tablet TAKE 1 TABLET BY MOUTH EVERY 12 HOURS WITH FOOD OR MILK AS NEEDED     ? omeprazole (PriLOSEC) 20 MG DR capsule TAKE 1 CAPSULE BY MOUTH EVERY DAY 30 MINUTES BEFORE MORNING MEAL     ? omeprazole (PriLOSEC) 40 MG DR capsule Take 40 mg by mouth daily before breakfast. Do not crush or chew.     ? rosuvastatin (Crestor) 10 MG tablet TAKE 1 TABLET BY MOUTH EVERY DAY FOR 30 DAYS     ? spironolactone (Aldactone) 50 MG tablet Take 50 mg by mouth 2 times daily.     ? zolpidem (Ambien) 10 MG tablet TAKE 1 TABLET BY MOUTH EVERY DAY AT BEDTIME AS NEEDED FOR 30 DAYS       No current facility-administered medications for this visit.     The patient's allergies: Ace inhibitors, Hydromorphone, and Ketorolac    Other relevant Gynecology  History:    Review of Systems   All other systems reviewed and are negative.    The patient's  height is 1.702 m and weight is 120.6 kg. Her blood pressure is 118/74 and her pulse is 74.     Physical Exam  Vitals and nursing note reviewed. Exam conducted with a chaperone present.   Constitutional:       Appearance: She is obese.   Abdominal:      General: There is no distension.      Palpations: Abdomen is soft. There is no mass.      Tenderness: There is no abdominal tenderness. There is no guarding or rebound.      Hernia: No hernia is present. There is no hernia in the left inguinal area or right inguinal area.   Genitourinary:     General: Normal vulva.      Exam position: Lithotomy position.      Labia:         Right: No rash, tenderness, lesion or injury.         Left: No rash, tenderness, lesion or injury.       Urethra: No prolapse.      Cervix: Dilated. No cervical bleeding.      Comments: Noted to have large mass in the endocervical canal about 6-8cm, dilating the cervix  Anterior lip of cervix visualized, IUD string noted anteriorly and can be palpated anterior to the mass  Unable to palpate a stalk, mass noted to be very broad and immobile    Unable to palpate uterus due  to body habitus  Adnexal and uterine palpation limited  Lymphadenopathy:      Lower Body: No right inguinal adenopathy. No left inguinal adenopathy.         Images noted in media    Procedure:  IUD strings noted anterior to prolapsing mass. Ring clamps were used to grasp the string but IUD was not able to be removed as tension was noted during the time of attempted removal thus procedure was aborted  Relevant Results:    Lab Data:    This SmartLink has not been configured with any valid records.     Appointment on 10/20/2021   Component Date Value Ref Range Status   ? Sodium 10/20/2021 135  135 - 145 mmol/L Final   ? Potassium 10/20/2021 4.8  3.6 - 5.1 mmol/L Final   ? Potassium Comment 10/20/2021 Not Hemolyzed  Not Hemolyzed Final   ?  Chloride 10/20/2021 103  100 - 110 mmol/L Final   ? Carbon Dioxide 10/20/2021 28  21 - 33 mmol/L Final   ? Anion Gap 10/20/2021 4  0 - 12 mmol/L Final   ? Glucose 10/20/2021 131 (H)  65 - 100 mg/dL Final   ? BUN 10/20/2021 26 (H)  8 - 23 mg/dL Final   ? Creatinine 10/20/2021 1.09 (H)  0.50 - 1.00 mg/dL Final   ? GFRcr (Estimated) 10/20/2021 60  >=60 mL/min/1.28m Final    Calculation based on the Chronic Kidney Disease Epidemiology Collaboration (CKD-EPI) 2021 equation refit without adjustment for race.   ? AST 10/20/2021 20  0 - 50 U/L Final   ? ALT 10/20/2021 24  0 - 50 U/L Final   ? Alk Phos 10/20/2021 59  0 - 120 U/L Final   ? Bilirubin, Total 10/20/2021 0.2  0.0 - 1.3 mg/dL Final   ? Protein, Total 10/20/2021 7.7  6.4 - 8.5 g/dL Final   ? Albumin 10/20/2021 4.3  3.7 - 5.2 g/dL Final   ? Globulin 10/20/2021 3.4  1.3 - 3.5 g/dL Final   ? Calcium 10/20/2021 9.2  8.9 - 10.7 mg/dL Final   ? WBC 10/20/2021 7.7  3.9 - 11.7 10e9/L Final   ? RBC 10/20/2021 3.89  3.85 - 5.16 10e12/L Final   ? HGB 10/20/2021 11.5 (L)  12.0 - 15.0 g/dL Final   ? HCT 10/20/2021 35.9  34.8 - 45.0 % Final   ? MCV 10/20/2021 92.3  78.5 - 96.4 fL Final   ? MCH 10/20/2021 29.6  25.6 - 32.2 pg Final   ? MCHC 10/20/2021 32.0  30.5 - 34.0 g/dL Final   ? RDW 10/20/2021 13.4  11.3 - 14.7 % Final   ? PLT 10/20/2021 262  172 - 440 10e9/L Final   ? MPV 10/20/2021 11.4  8.7 - 12.3 fL Final   ? % NRBC 10/20/2021 0.0  0.0 - 0.2 % Final   ? % Neu 10/20/2021 86.4  % Final   ? % Lym 10/20/2021 11.0  % Final   ? % Mono 10/20/2021 2.3  % Final   ? % Eos 10/20/2021 0.0  % Final   ? % Baso 10/20/2021 0.3  % Final   ? NEU 10/20/2021 6.7  1.9 - 7.9 10e9/L Final   ? LYM 10/20/2021 0.9 (L)  1.3 - 3.6 10e9/L Final   ? MONO 10/20/2021 0.2 (L)  0.3 - 0.7 10e9/L Final   ? EOS 10/20/2021 <0.1  0.0 - 0.4 10e9/L Final   ? BASO 10/20/2021 <0.1  0.0 - 0.1 10e9/L Final   ? HIV Interpretation 10/20/2021 Negative  Negative Final    HIV-1 p24 Ag and HIV-1/HIV-2 Ab not detected.    ? Syphilis Total Ab 10/20/2021 Nonreactive  Nonreactive Final    Testing performed by Chemiluminescent Immunoassay.   Walk-In on 10/10/2021   Component Date Value Ref Range Status   ? Follicle Stimulating Hormone 10/10/2021 65.2  See comment mIU/mL Final    FSH Reference Interval, (mIU/mL):   Males:                0.9-12.0   Females:      Follicular phase:  6.2-3.7        Midcycle:          2.5-16.7        Luteal phase:      1.4-5.5      Postmenopausal:    26.7-133.4     Imaging:  CT abdomen pelvis w IV contrast  Status: Final result     Study Result    Narrative & Impression     STUDY: CT of the Abdomen and Pelvis with IV Contrast 10/25/2021    CLINICAL HISTORY: N89.8: Other specified noninflammatory disorders of vagina.    TECHNIQUE: A CT examination of the abdomen and pelvis was performed. Axial images were obtained from the hepatic dome to the inferior pubic bones following intravenous contrast. Additional set of delayed images through the pelvis in the excretory phase was obtained.    IV Contrast: 100 ML of Visipaque 320     Oral Contrast: Barium    COMPARISON: None     FINDINGS:    Lung Bases: No focal consolidations or evidence of airspace disease within the visualized lung fields.    Liver: The liver is normal in size and morphology. No suspicious liver lesion.    Gallbladder and Bile Ducts:  Status post cholecystectomy. No bilary ductal dilatation.    Pancreas: The pancreas is normal in appearance. No pancreatic ductal dilatation.    Spleen:  Spleen is within normal size without focal abnormality.     Adrenals: No adrenal mass.    Kidneys, Collecting systems: No hydronephrosis or perinephric stranding. No suspicious renal mass.      Urinary Bladder: No definite focal bladder abnormality.    Reproductive Organs: Enlarged uterus measures 19 x 9 x 12 cm in craniocaudal, AP and transverse dimension, respectively. 2 dominant intramural fibroids in the uterine fundus with a few additional  smaller fibroids. A large ovoid mass likely distends the cervix, and may reflect a fibroid originating from the cervix versus a prolapsed fibroid; this mass is ovoid and homogeneous, measuring 10.5 x 8.3 x 9.2, likely an endocervical fibroid. This large mass appears to be displacing the inferior aspect of the infant treated uterine device anteriorly. The intrauterine device is malpositioned, extending from lower uterine segment to the cervix (series 5, image [id number]). The distal tip of the intrauterine device abuts the wall of the anterior cervix versus anterior lower uterine segment.    Peritoneum, Mesentery, Retroperitoneum: No free or loculated fluid in the abdomen or pelvis. No pneumoperitoneum.  No retroperitoneal hematoma or soft tissue mass.    Nodes: No lymphadenopathy by size criteria.      Bowel: No evidence of mechanical intestinal obstruction. No definite focal bowel wall thickening. Appendix is normal (series 3, image [id number]).    Vasculature: Portal, splenic, and superior mesenteric veins are patent.  No abdominal aortic aneurysm.    Soft tissues: Unremarkable  Bones: No suspicious osseous lesion. Multilevel degenerative changes.    IMPRESSION:  Final report  1.  Enlarged fibroid uterus noted, with numerous fundal fibroids, as well as a large mass favored to be positioned within the endocervical canal, likely an endocervical fibroid. This may reflect a prolapsed fibroid versus a fibroid originating from the cervix. Ultrasound pelvis versus MRI pelvis without and with contrast could be considered for more definitive assessment.  2.  Intrauterine device malpositioned and visualized within the inferior segment of the uterus with anterior displacement of the distal portion of the device. The distal tip of the intrauterine device abuts the anterior wall of the lower uterine segment versus the anterior wall of the cervix. Position of intrauterine device may cause the patient to be at risk for uterine  perforation. There is no evidence of uterine perforation at this time.  3.  Status post cholecystectomy.    Dictated By: Josie Dixon 10/27/2021 3:59 PM  I have reviewed the images and dictated, reviewed or edited the final report.    I have reviewed the images and dictated, reviewed or edited the final report.    Dictated By: Josie Dixon   Electronically Verified by: Rolanda Lundborg 10/27/2021 4:30 PM         Electronically signed by Dorian Pod, MD at 11/08/2021  4:00 PM EDT

## 2021-11-13 NOTE — Telephone Encounter (Signed)
Formatting of this note might be different from the original.  Order has been faxed over, attempted to call patient on 10/27, call was dropped, attempted to call back and was unable to get through.   Electronically signed by Marja Kays, RN at 11/13/2021 10:25 AM EDT

## 2021-11-14 ENCOUNTER — Encounter

## 2021-11-14 ENCOUNTER — Emergency Department: Payer: BLUE CROSS/BLUE SHIELD

## 2021-11-14 ENCOUNTER — Emergency Department: Admit: 2021-11-15 | Payer: BLUE CROSS/BLUE SHIELD

## 2021-11-14 ENCOUNTER — Inpatient Hospital Stay
Admit: 2021-11-14 | Discharge: 2021-11-15 | Disposition: A | Discharge status: Home / Self Care | Payer: BLUE CROSS/BLUE SHIELD | Attending: Family Medicine

## 2021-11-14 DIAGNOSIS — D259 Leiomyoma of uterus, unspecified: Secondary | ICD-10-CM

## 2021-11-14 DIAGNOSIS — R1032 Left lower quadrant pain: Secondary | ICD-10-CM

## 2021-11-14 LAB — COMPREHENSIVE METABOLIC PANEL
ALT: 22 U/L (ref 12–78)
AST: 12 U/L — ABNORMAL LOW (ref 15–37)
Albumin/Globulin Ratio: 0.9 — ABNORMAL LOW (ref 1.1–2.2)
Albumin: 3.3 g/dL — ABNORMAL LOW (ref 3.5–5.0)
Alk Phosphatase: 52 U/L (ref 45–117)
Anion Gap: 7 mmol/L (ref 5–15)
BUN: 19 MG/DL (ref 6–20)
Bun/Cre Ratio: 15 (ref 12–20)
CO2: 29 mmol/L (ref 21–32)
Calcium: 8.3 MG/DL — ABNORMAL LOW (ref 8.5–10.1)
Chloride: 101 mmol/L (ref 97–108)
Creatinine: 1.3 MG/DL — ABNORMAL HIGH (ref 0.55–1.02)
Est, Glom Filt Rate: 49 mL/min/{1.73_m2} — ABNORMAL LOW (ref >60)
Globulin: 3.8 g/dL (ref 2.0–4.0)
Glucose: 103 mg/dL — ABNORMAL HIGH (ref 65–100)
Potassium: 4.5 mmol/L (ref 3.5–5.1)
Sodium: 137 mmol/L (ref 136–145)
Total Bilirubin: 0.2 MG/DL (ref 0.2–1.0)
Total Protein: 7.1 g/dL (ref 6.4–8.2)

## 2021-11-14 LAB — CBC WITH AUTO DIFFERENTIAL
Absolute Immature Granulocyte: 0 10*3/uL (ref 0.00–0.04)
Basophils %: 1 % (ref 0–1)
Basophils Absolute: 0 10*3/uL (ref 0.0–0.1)
Eosinophils %: 5 % (ref 0–7)
Eosinophils Absolute: 0.3 10*3/uL (ref 0.0–0.4)
Hematocrit: 35.6 % (ref 35.0–47.0)
Hemoglobin: 11.2 g/dL — ABNORMAL LOW (ref 11.5–16.0)
Immature Granulocytes: 0 % (ref 0.0–0.5)
Lymphocytes %: 26 % (ref 12–49)
Lymphocytes Absolute: 1.4 10*3/uL (ref 0.8–3.5)
MCH: 30.3 PG (ref 26.0–34.0)
MCHC: 31.5 g/dL (ref 30.0–36.5)
MCV: 96.2 FL (ref 80.0–99.0)
MPV: 10.8 FL (ref 8.9–12.9)
Monocytes %: 8 % (ref 5–13)
Monocytes Absolute: 0.4 10*3/uL (ref 0.0–1.0)
Neutrophils %: 60 % (ref 32–75)
Neutrophils Absolute: 3.2 10*3/uL (ref 1.8–8.0)
Nucleated RBCs: 0 PER 100 WBC
Platelets: 221 10*3/uL (ref 150–400)
RBC: 3.7 M/uL — ABNORMAL LOW (ref 3.80–5.20)
RDW: 13.5 % (ref 11.5–14.5)
WBC: 5.3 10*3/uL (ref 3.6–11.0)
nRBC: 0 10*3/uL (ref 0.00–0.01)

## 2021-11-14 LAB — URINALYSIS WITH MICROSCOPIC
Blood, Urine: NEGATIVE
Glucose, UA: NEGATIVE mg/dL
Ketones, Urine: NEGATIVE mg/dL
Nitrite, Urine: NEGATIVE
Protein, UA: NEGATIVE mg/dL
Specific Gravity, UA: 1.015 (ref 1.003–1.030)
Urobilinogen, Urine: 0.2 EU/dL (ref 0.2–1.0)
pH, Urine: 6 (ref 5.0–8.0)

## 2021-11-14 LAB — LIPASE: Lipase: 50 U/L (ref 13–75)

## 2021-11-14 LAB — BILIRUBIN, CONFIRMATORY: Bilirubin Confirmation, UA: NEGATIVE

## 2021-11-14 MED ORDER — IBUPROFEN 600 MG PO TABS
600 MG | ORAL | Status: AC
Start: 2021-11-14 — End: 2021-11-14
  Administered 2021-11-14: 23:00:00 600 mg via ORAL

## 2021-11-14 MED ORDER — IOPAMIDOL 76 % IV SOLN
76 % | Freq: Once | INTRAVENOUS | Status: DC | PRN
Start: 2021-11-14 — End: 2021-11-14

## 2021-11-14 MED ORDER — ONDANSETRON HCL 4 MG/2ML IJ SOLN
4 MG/2ML | INTRAMUSCULAR | Status: AC
Start: 2021-11-14 — End: 2021-11-14
  Administered 2021-11-14: 23:00:00 4 mg via INTRAVENOUS

## 2021-11-14 MED FILL — ISOVUE-370 76 % IV SOLN: 76 % | INTRAVENOUS | Qty: 100

## 2021-11-14 MED FILL — IBUPROFEN 600 MG PO TABS: 600 MG | ORAL | Qty: 1

## 2021-11-14 MED FILL — ONDANSETRON HCL 4 MG/2ML IJ SOLN: 4 MG/2ML | INTRAMUSCULAR | Qty: 2

## 2021-11-14 NOTE — ED Notes (Addendum)
Discharge instructions provided. Pt verbalized understanding. Opportunity provided for questions. Pt discharged home.      Jetta Lout, RN  11/14/21 [id number]       Jetta Lout, RN  11/14/21 [id number]

## 2021-11-14 NOTE — ED Triage Notes (Signed)
ED triage note: Ambulatory with a steady gait. Patient reports LLQ abdominal pain and left lower back pain for years that got worse over the past few days. States left leg started hurting today. States the pain is worse when sitting.

## 2021-11-14 NOTE — Discharge Instructions (Signed)
Take ibuprofen and tylenol as needed for pain. Take ibuprofen with food.     Follow-up with your OBGYN and get your MRA as soon as possible. Call for appointment as soon as possible.

## 2021-11-14 NOTE — ED Provider Notes (Signed)
[facility]  EMERGENCY DEPARTMENT ENCOUNTER      Pt Name: Erica Oconnor  MRN: [id number]  [place] 1965-12-06  Date of evaluation: 11/14/2021  Provider: Cliffton Asters, APRN - NP    [place]       Chief Complaint   Patient presents with    Abdominal Pain    Back Pain    Leg Pain         HISTORY OF PRESENT ILLNESS   (Location/Symptom, Timing/Onset, Context/Setting, Quality, Duration, Modifying Factors, Severity)  Note limiting factors.   Shevy Ferrie is a 56 y.o. female presenting to the ED co LLQ dull abdominal pain and left back pain that is constant since today. Pt reports sitting makes the pain worse and lying down makes pain better.  Pt reports taking advil today morning at 12:30 PM. Pt reports has hx uterine fibroids that she is working with her OBGYN about. Pt also reports having left achy pain to her left thigh.     Denies vomiting, fever, chills, hematuria, dysuria, diarrhea, recent falls/ injury, swelling of lower legs, recent travels, recent hospitalization, chemo treatment, use of hormone therapy, hx blood clots, hx DM, chest pain, sob.     Surgical hx: cholecystectomy.     The history is provided by the patient. No language interpreter was used.         Review of External Medical Records:     Nursing Notes were reviewed.    REVIEW OF SYSTEMS    (2-9 systems for level 4, 10 or more for level 5)     Review of Systems   Constitutional:  Negative for activity change, appetite change, chills and fever.   Respiratory:  Negative for shortness of breath.    Cardiovascular:  Negative for chest pain and leg swelling.   Gastrointestinal:  Positive for abdominal pain and nausea. Negative for diarrhea and vomiting.   Genitourinary:  Positive for flank pain. Negative for decreased urine volume, difficulty urinating, dysuria and hematuria.   Musculoskeletal:  Positive for back pain.        Left thigh pain   Skin:  Negative for rash.   All other systems reviewed and are negative.      Except as noted  above the remainder of the review of systems was reviewed and negative.       PAST MEDICAL HISTORY     Past Medical History:   Diagnosis Date    Depression     Diastolic CHF ([place])     GERD (gastroesophageal reflux disease)     Heart failure ([place])     Hypertension     Morbid obesity ([place])     Other ill-defined conditions(799.89)     Migraines, anemia    Psychiatric disorder     Depression, Anxiety    Unspecified sleep apnea     CPAP         SURGICAL HISTORY     History reviewed. No pertinent surgical history.      CURRENT MEDICATIONS       Previous Medications    AMLODIPINE (NORVASC) 10 MG TABLET    Take 1 tablet by mouth daily    ASPIRIN 81 MG CHEWABLE TABLET    Take 1 tablet by mouth daily    ETODOLAC (LODINE XL) 400 MG EXTENDED RELEASE TABLET    Take 1 tablet by mouth daily    FLUOXETINE (PROZAC) 20 MG CAPSULE    Take 2 capsules by mouth daily  FOLIC ACID (FOLVITE) 1 MG TABLET    Take 1 tablet by mouth daily    FUROSEMIDE (LASIX) 40 MG TABLET    Take 1 tablet by mouth daily    GABAPENTIN (NEURONTIN) 300 MG CAPSULE    Take 1 capsule by mouth 3 times daily. Max Daily Amount: 900 mg    GUANFACINE (TENEX) 1 MG TABLET    Take 2 tablets by mouth nightly    IRBESARTAN (AVAPRO) 300 MG TABLET    Take 1 tablet by mouth nightly    METOPROLOL TARTRATE (LOPRESSOR) 50 MG TABLET    Take 1 tablet by mouth 2 times daily    OMEPRAZOLE (PRILOSEC) 20 MG DELAYED RELEASE CAPSULE    Take 1 capsule by mouth daily    ROSUVASTATIN (CRESTOR) 10 MG TABLET    Take 1 tablet by mouth daily    SPIRONOLACTONE (ALDACTONE) 50 MG TABLET    Take 1 tablet by mouth 2 times daily       ALLERGIES     Toradol [ketorolac tromethamine], Ace inhibitors, and Hydrocodone    FAMILY HISTORY     History reviewed. No pertinent family history.       SOCIAL HISTORY       Social History     Socioeconomic History    Marital status: Single     Spouse name: None    Number of children: None    Years of education: None    Highest education level: None   Tobacco Use     Smoking status: Never   Substance and Sexual Activity    Alcohol use: No    Drug use: No       PHYSICAL EXAM    (up to 7 for level 4, 8 or more for level 5)     ED Triage Vitals   BP Temp Temp src Pulse Resp SpO2 Height Weight   -- -- -- -- -- -- -- --       Body mass index is 42.82 kg/m.    Physical Exam  Vitals reviewed.   Constitutional:       General: She is not in acute distress.     Appearance: Normal appearance. She is well-developed. She is not ill-appearing.   HENT:      Head: Normocephalic and atraumatic.      Right Ear: Tympanic membrane normal.      Left Ear: Tympanic membrane normal.      Nose: Nose normal. No rhinorrhea.      Mouth/Throat:      Mouth: Mucous membranes are moist.   Eyes:      Extraocular Movements: Extraocular movements intact.      Conjunctiva/sclera: Conjunctivae normal.      Pupils: Pupils are equal, round, and reactive to light.   Cardiovascular:      Rate and Rhythm: Normal rate and regular rhythm.   Pulmonary:      Effort: Pulmonary effort is normal. No respiratory distress.      Breath sounds: Normal breath sounds. No wheezing, rhonchi or rales.   Abdominal:      General: Bowel sounds are normal. There is no distension.      Palpations: There is no mass.      Tenderness: There is abdominal tenderness in the epigastric area, left upper quadrant and left lower quadrant. There is left CVA tenderness. There is no right CVA tenderness or guarding.      Hernia: No hernia is present.   Musculoskeletal:  General: Normal range of motion.      Cervical back: Normal range of motion. No bony tenderness.      Thoracic back: No bony tenderness.      Lumbar back: No bony tenderness.        Back:       Left hip: No deformity or bony tenderness.      Left upper leg: No swelling, tenderness or bony tenderness.      Left knee: No swelling or bony tenderness.   Skin:     General: Skin is warm.      Capillary Refill: Capillary refill takes less than 2 seconds.   Neurological:      General: No  focal deficit present.      Mental Status: She is alert and oriented to person, place, and time. Mental status is at baseline.      Sensory: No sensory deficit.      Gait: Gait normal.   Psychiatric:         Mood and Affect: Mood normal.         DIAGNOSTIC RESULTS     EKG: All EKG's are interpreted by the Emergency Department Physician who either signs or Co-signs this chart in the absence of a cardiologist.    RADIOLOGY:   Non-plain film images such as CT, Ultrasound and MRI are read by the radiologist. Plain radiographic images are visualized and preliminarily interpreted by the emergency physician with the below findings:    Interpretation per the Radiologist below, if available at the time of this note:    CT ABDOMEN PELVIS W IV CONTRAST Additional Contrast? None    (Results Pending)   [place] NON OB TRANSVAGINAL    (Results Pending)        LABS:  Labs Reviewed   URINALYSIS WITH MICROSCOPIC - Abnormal; Notable for the following components:       Result Value    Appearance HAZY (*)     Leukocyte Esterase, Urine SMALL (*)     Epithelial Cells UA MANY (*)     BACTERIA, URINE 2+ (*)     All other components within normal limits   CBC WITH AUTO DIFFERENTIAL - Abnormal; Notable for the following components:    RBC 3.70 (*)     Hemoglobin 11.2 (*)     All other components within normal limits   COMPREHENSIVE METABOLIC PANEL - Abnormal; Notable for the following components:    Glucose 103 (*)     Creatinine 1.30 (*)     Est, Glom Filt Rate 49 (*)     Calcium 8.3 (*)     AST 12 (*)     Albumin 3.3 (*)     Albumin/Globulin Ratio 0.9 (*)     All other components within normal limits   URINE CULTURE HOLD SAMPLE   BILIRUBIN, CONFIRMATORY   LIPASE       All other labs were within normal range or not returned as of this dictation.    EMERGENCY DEPARTMENT COURSE and DIFFERENTIAL DIAGNOSIS/MDM:   Vitals:    Vitals:    11/14/21 1810 11/14/21 1818 11/14/21 1934   BP: (!) 154/66 139/75 (!) 164/88   Pulse: 70 65    Resp: 18     Temp:  97.8 F (36.6 C)     TempSrc: Oral     SpO2: 97% 96% 95%   Weight: 124 kg (273 lb 5.9 oz)     Height: 1.702 m ('5\' 7"'$ )  Presentation, management, and disposition were discussed with the attending physician, Dr. Cornelia Copa who is in agreement with plan of care.  Reviewed case with Dr. Cornelia Copa. No CT scan is needed at this time given recent CT scan done on 10/25/2021.    Medical Decision Making  Amount and/or Complexity of Data Reviewed  Labs: ordered.  Radiology: ordered.    Risk  Prescription drug management.    Pt is a 56 y/o female presenting to the ED c/o LLQ abdominal pain and chronic back pain that increases when she is sitting up. Doubt infectious process given WBC is 5.3 and pt is afebrile. Doubt acute pancreatitis given lipase is 50. Suspect pain is related to uterine fibroids. Pt is encouraged to follow-up closely with her provider and get the MRA done as soon as possible. Pt states understanding. Pt was given ibuprofen, zofran, and a dose of percocet while in the ED to help with pain. Pt is encouraged to follow-up with her GYN for reevaluation of her symptoms as soon as possible. Strict return to ED precautions given. ACI discussed with the patient; see instruction below. Patient verbalized understanding.      CONSULTS:  None    PROCEDURES:  Unless otherwise noted below, none     Procedures      FINAL IMPRESSION      1. Left lower quadrant abdominal pain    2. Uterine leiomyoma, unspecified location          DISPOSITION/PLAN   DISPOSITION Discharge - Pending Orders Complete 11/14/2021 08:33:29 PM      PATIENT REFERRED TO:  OBGYN    Schedule an appointment as soon as possible for a visit         DISCHARGE MEDICATIONS:  New Prescriptions    No medications on file         (Please note that portions of this note were completed with a voice recognition program.  Efforts were made to edit the dictations but occasionally words are mis-transcribed.)    Cliffton Asters, APRN - NP (electronically signed)  Emergency  Attending Physician / Physician Assistant / Nurse Practitioner             Cliffton Asters, APRN - NP  11/14/21 2056

## 2021-11-15 ENCOUNTER — Encounter

## 2021-11-15 ENCOUNTER — Ambulatory Visit: Payer: BLUE CROSS/BLUE SHIELD

## 2021-11-15 ENCOUNTER — Inpatient Hospital Stay: Admit: 2021-11-15 | Payer: BLUE CROSS/BLUE SHIELD | Attending: Ophthalmology

## 2021-11-15 DIAGNOSIS — D259 Leiomyoma of uterus, unspecified: Secondary | ICD-10-CM

## 2021-11-15 DIAGNOSIS — H4710 Unspecified papilledema: Secondary | ICD-10-CM

## 2021-11-15 MED ORDER — OXYCODONE-ACETAMINOPHEN 5-325 MG PO TABS
5-325 MG | ORAL | Status: DC
Start: 2021-11-15 — End: 2021-11-14

## 2021-11-15 MED ORDER — GADOTERIDOL 279.3 MG/ML IV SOLN
279.3 MG/ML | Freq: Once | INTRAVENOUS | Status: AC | PRN
Start: 2021-11-15 — End: 2021-11-15
  Administered 2021-11-15: 21:00:00 20 mL via INTRAVENOUS

## 2021-11-15 MED ORDER — GADOTERIDOL 279.3 MG/ML IV SOLN
279.3 MG/ML | Freq: Once | INTRAVENOUS | Status: AC | PRN
Start: 2021-11-15 — End: 2021-11-19

## 2021-11-15 MED FILL — OXYCODONE-ACETAMINOPHEN 5-325 MG PO TABS: 5-325 MG | ORAL | Qty: 1

## 2021-11-15 MED FILL — PROHANCE 279.3 MG/ML IV SOLN: 279.3 MG/ML | INTRAVENOUS | Qty: 20

## 2021-11-27 ENCOUNTER — Encounter: Payer: BLUE CROSS/BLUE SHIELD | Attending: Neurology

## 2021-11-28 ENCOUNTER — Ambulatory Visit: Admit: 2021-11-28 | Payer: BLUE CROSS/BLUE SHIELD | Attending: Neurology

## 2021-11-28 DIAGNOSIS — G932 Benign intracranial hypertension: Secondary | ICD-10-CM

## 2021-11-28 MED ORDER — ACETAZOLAMIDE 250 MG PO TABS
250 MG | ORAL_TABLET | Freq: Two times a day (BID) | ORAL | 5 refills | Status: DC
Start: 2021-11-28 — End: 2021-11-28

## 2021-11-28 MED ORDER — ACETAZOLAMIDE 250 MG PO TABS
250 MG | ORAL_TABLET | Freq: Two times a day (BID) | ORAL | 5 refills | Status: AC
Start: 2021-11-28 — End: 2022-06-05

## 2021-11-28 MED ORDER — RIMEGEPANT SULFATE 75 MG PO TBDP
75 | ORAL_TABLET | ORAL | 5 refills | Status: DC | PRN
Start: 2021-11-28 — End: 2023-08-30

## 2021-11-28 MED ORDER — RIMEGEPANT SULFATE 75 MG PO TBDP
75 | ORAL_TABLET | ORAL | 5 refills | Status: DC | PRN
Start: 2021-11-28 — End: 2021-11-28

## 2021-11-28 NOTE — Progress Notes (Signed)
NEUROLOGY  NEW PATIENT EVALUATION/CONSULTATION       PATIENT NAME: Erica Oconnor    MRN: [id number]    REASON FOR CONSULTATION: Headaches    11/28/21      Previous records (physician notes, laboratory reports, and radiology reports) and imaging studies were reviewed and summarized. My recommendations will be communicated back to the patient's physician(s) via electronic medical record and/or by [place] mail.       HISTORY OF PRESENT ILLNESS:  Erica Oconnor is a 56 y.o. female presenting for evaluation of headaches. She reports onset of headaches around age 30-24.   Location: Bi-frontal or over the vertex  Character: Pressure/throbbing  Intensity: On average 6/10   Frequency: Daily  # HA free days per month: 0  Duration: Several hours  Aura: Yes, visual  Associated Sx with HA: +nausea/vomiting, +phonophotophobia.   Neurological ROS: Denies focal weakness, numbness or vision loss associated with headaches. +blurred vision. +intermittent tinnitus, non-pulsatile. Prior dx IIH w/papilledema (Dr. Lucy Antigua). Prior LP with opening pressure>50cmH2O.   Systemic ROS:   +OSA untreated  Caffeine use: 4 cups daily   H/O Head trauma: None  Depressive or anxiety Sx: Yes, treated     Any change in pattern of HA? None    Triggers: Inadequate sleep. Improved when lying down or with rest  Alleviating factors: Rest/sleep, quiet   FHx HA/migraine: Mother    Treatment so far: Metoprolol, fluoxetine-ineffective. Using Fioricet daily for rescue headache treatment for the past 3 months.   Prior preventatives: Marland Kitchen Topamax, Gabapentin, Elavil, Vit B complex, Prozac, Norvasc, Clonidine, toprol, Botox injections (effective but botox not covered by her new insurance), Diamox  Prior rescue: Relpax, Fioricet, Imitrex, hydrocodone    Investigations so far:   11/15/21 MRI Brain/MRV no acute pathology, no VST/significant venous sinus stenosis      PAST MEDICAL HISTORY:  Past Medical History:   Diagnosis Date    Depression     Diastolic CHF ([place])      GERD (gastroesophageal reflux disease)     Heart failure ([place])     Hypertension     Morbid obesity ([place])     Other ill-defined conditions(799.89)     Migraines, anemia    Psychiatric disorder     Depression, Anxiety    Unspecified sleep apnea     CPAP       PAST SURGICAL HISTORY:  Past Surgical History:   Procedure Laterality Date    CHOLECYSTECTOMY  2015       FAMILY HISTORY:   Family History   Problem Relation Age of Onset    Hypertension Mother     Headache Mother     Diabetes Father     Hypertension Father          SOCIAL HISTORY:  Social History     Socioeconomic History    Marital status: Single     Spouse name: None    Number of children: None    Years of education: None    Highest education level: None   Tobacco Use    Smoking status: Never   Vaping Use    Vaping Use: Never used   Substance and Sexual Activity    Alcohol use: Yes    Drug use: Yes     Types: Marijuana Sherrie Mustache)         MEDICATIONS:   Current Outpatient Medications   Medication Sig Dispense Refill    Iron, Ferrous Sulfate, 325 (65 Fe) MG TABS daily  Vitamin D, Ergocalciferol, 50000 units CAPS once a week      irbesartan (AVAPRO) 300 MG tablet Take 1 tablet by mouth nightly      gabapentin (NEURONTIN) 300 MG capsule Take 1 capsule by mouth 3 times daily.      etodolac (LODINE XL) 400 MG extended release tablet Take 1 tablet by mouth daily      amLODIPine (NORVASC) 10 MG tablet Take 1 tablet by mouth daily      spironolactone (ALDACTONE) 50 MG tablet Take 1 tablet by mouth 2 times daily      folic acid (FOLVITE) 1 MG tablet Take 1 tablet by mouth daily      metoprolol tartrate (LOPRESSOR) 50 MG tablet Take 1 tablet by mouth 2 times daily      omeprazole (PRILOSEC) 20 MG delayed release capsule Take 1 capsule by mouth daily      FLUoxetine (PROZAC) 20 MG capsule Take 4 capsules by mouth daily      aspirin 81 MG chewable tablet Take 1 tablet by mouth daily      furosemide (LASIX) 40 MG tablet Take 1 tablet by mouth daily      guanFACINE (TENEX) 1  MG tablet Take 2 tablets by mouth nightly      rosuvastatin (CRESTOR) 10 MG tablet Take 1 tablet by mouth daily       No current facility-administered medications for this visit.         ALLERGIES:  Allergies   Allergen Reactions    Toradol [Ketorolac Tromethamine] Shortness Of Breath    Ace Inhibitors Hives and Swelling    Hydrocodone Itching         REVIEW OF SYSTEMS:  10 point ROS reviewed with patient. Please see scanned document under media.       PHYSICAL EXAM:  Vital Signs: BP 138/76   Pulse 55   Ht 1.702 m ('5\' 7"'$ )   Wt 122 kg (269 lb)   SpO2 98%   BMI 42.13 kg/m      General Medical Exam:  General:  Well appearing, comfortable, in no apparent distress.  Eyes/ENT: see cranial nerve examination.  Neck: No masses appreciated.  Full range of motion without tenderness.  Respiratory:  Clear to auscultation, good air entry bilaterally.  Cardiac:  Regular rate and rhythm, no murmur.   GI:  Soft, non-tender, non-distended abdomen.  Bowel sounds normal. No masses, organomegaly.   Extremities:  No deformities, edema, or skin discoloration.Skin:  No rashes or lesions.    Neurological:  Mental Status:  Alert and oriented to person, place, and time with fluent speech.   Cranial Nerves:   CNII/III/IV/VI: +Papilledema b/l, visual fields full to confrontation, EOMI, PERRL, no ptosis or nystagmus.   CN V: Facial sensation intact bilaterally, masseter 5/5   CN VII: Facial muscles symmetric and strong   CN VIII: Hears finger rub well bilaterally, intact vestibular function   CN IX/X: Normal palatal movement   CN XI: Full strength shoulder shrug bilaterally   CN XII: Tongue protrusion full and midline without fasciculation or atrophy  Motor: Normal tone and muscle bulk with no pronator drift. No atrophy or fasciculations present on examination.  Individual muscle group testing:  Shoulder abduction:   Left:5/5   Right : 5/5    Shoulder adduction:   Left:5/5   Right : 5/5    Elbow flexion:      Left:5/5   Right : 5/5  Elbow  extension:    Left:5/5  Right : 5/5   Wrist flexion:     Left:5/5   Right : 5/5  Wrist extension:    Left:5/5   Right : 5/5  Arm pronation:   Left:5/5   Right : 5/5  Arm supination:   Left:5/5   Right : 5/5    Finger flexion:    Left:5/5   Right : 5/5    Finger extension:   Left:5/5   Right : 5/5   Finger abduction:  Left:5/5   Right : 5/5   Finger adduction:   Left:5/5   Right : 5/5  Hip flexion:     Left:5/5   Right : 5/5         Hip extension:   Left:5/5   Right : 5/5    Knee flexion:    Left:5/5   Right : 5/5    Knee extension:   Left:5/5   Right : 5/5    Dorsiflexion:     Left:5/5   Right : 5/5  Plantar flexion:    Left:5/5   Right : 5/5       MSRs: No crossed adductors or clonus.         RIGHT  LEFT   Brachioradialis 2+ 2+   Biceps 2+ 2+   Triceps 2+ 2+   Knee 2+ 2+   Achilles 1+ 0        Plantar response Downward Downward          Sensation: Normal and symmetric perception of pinprick, temperature, light touch, proprioception, and vibration;  Coordination: No dysmetria. Normal rapid alternating movements; finger-to-nose and heel-to- shin testing are within normal limits.  Gait: Normal native gait.     PERTINENT DATA:  INTERNAL RECORDS:  The patient's electronic medical record was reviewed.  The relevant details include:    MRI Results (maximum last 3):  MRI BRAIN W WO CONTRAST 11/15/2021    Narrative  EXAM:  MRI BRAIN W WO CONTRAST, MRV HEAD WO CONTRAST    INDICATION:    Unspecified papilledema    COMPARISON:  None.    CONTRAST: 20 ml ProHance.    TECHNIQUE:  Multiplanar multisequence acquisition without and with contrast of the brain and  orbits.  Time-of-flight noncontrast MRV of the brain was performed. Multiplanar and MIP  reconstructions were obtained.    FINDINGS:  MRI Brain:  The globes are normal. In the posterior inferior intraconal fat of the right  orbit between the optic nerve sheath and inferior rectus muscle, there is a  tubular STIR hyperintense, nonenhancing lesion which extends towards the  orbital  apex, consistent with a varix. The optic nerves are normal in size and signal.  There is no significant enlargement of the optic nerve sheaths. The extraocular  muscles are normal. No other evidence of intraorbital mass or abnormal  intraorbital enhancement. The cavernous sinuses are unremarkable.    The ventricles are normal in size and position. The brain parenchyma has normal  signal characteristics for age, with minimal tiny scattered subcortical and deep  white matter T2/FLAIR hyperintensities that are of no clinical significance.  Incidental partially empty sella. There is no acute infarct, hemorrhage,  extra-axial fluid collection, or mass effect. There is no cerebellar tonsillar  herniation. Expected arterial flow-voids are present. No evidence of abnormal  enhancement.    Small retention cysts in bilateral maxillary sinuses. The mastoid air cells and  middle ears are clear. No significant osseous or scalp lesions are identified.    MRV Head:  The dural venous sinuses and deep cerebral veins are patent without evidence of  thrombosis. Codominant transverse sinuses, with no significant transverse sinus  stenosis.    Impression  MRI Brain:  1. No overt imaging evidence of intracranial hypertension. Incidental right  orbital varix. Otherwise no significant intraorbital abnormality.  2. No significant intracranial abnormality. Incidental partially empty sella.    MRV Head:  1. Normal MRV head.      MRI PELVIS W WO CONTRAST 11/15/2021    Narrative  MRI OF THE PELVIS WITH AND WITHOUT CONTRAST.    INDICATION: Uterine fibroids. Prolapsing uterine mass/fibroid.    COMPARISON: None.    TECHNIQUE: Multisequence and multiplanar MRI of the pelvis was performed before  and after the administration of 20 cc IV gadoteridol (ProHance).    FINDINGS:  The uterus is enlarged and replaced with multiple fibroids. It measures 130 x 98  x 206 mm in greatest dimensions, and extends to the level of the L3 inferior  endplate,  above the umbilicus. There is no visible endometrium or junctional  zone.    The largest fibroid is in the cervix and/or lower uterine segment. It measures  94 x 100 x 115 mm. It fills the inferior pelvis mediolaterally, with mass effect  and narrowing of the right greater than left common iliac veins (20-32),  anterior displacement and compression of the bladder (21-20), and splaying of  the vaginal fornices (21-20).    No pelvic lymphadenopathy. There is trace free fluid. The visualized portions of  the bowel are normal.    Impression  1. Uterus enlarged and replaced with fibroids.  2. Large cervical/lower uterine segment fibroid, with mass effect as described  above.      MRV BRAIN WO CONTRAST 11/15/2021    Narrative  EXAM:  MRI BRAIN W WO CONTRAST, MRV HEAD WO CONTRAST    INDICATION:    Unspecified papilledema    COMPARISON:  None.    CONTRAST: 20 ml ProHance.    TECHNIQUE:  Multiplanar multisequence acquisition without and with contrast of the brain and  orbits.  Time-of-flight noncontrast MRV of the brain was performed. Multiplanar and MIP  reconstructions were obtained.    FINDINGS:  MRI Brain:  The globes are normal. In the posterior inferior intraconal fat of the right  orbit between the optic nerve sheath and inferior rectus muscle, there is a  tubular STIR hyperintense, nonenhancing lesion which extends towards the orbital  apex, consistent with a varix. The optic nerves are normal in size and signal.  There is no significant enlargement of the optic nerve sheaths. The extraocular  muscles are normal. No other evidence of intraorbital mass or abnormal  intraorbital enhancement. The cavernous sinuses are unremarkable.    The ventricles are normal in size and position. The brain parenchyma has normal  signal characteristics for age, with minimal tiny scattered subcortical and deep  white matter T2/FLAIR hyperintensities that are of no clinical significance.  Incidental partially empty sella. There is no  acute infarct, hemorrhage,  extra-axial fluid collection, or mass effect. There is no cerebellar tonsillar  herniation. Expected arterial flow-voids are present. No evidence of abnormal  enhancement.    Small retention cysts in bilateral maxillary sinuses. The mastoid air cells and  middle ears are clear. No significant osseous or scalp lesions are identified.    MRV Head:  The dural venous sinuses and deep cerebral veins are patent without evidence of  thrombosis. Codominant transverse sinuses, with no significant transverse  sinus  stenosis.    Impression  MRI Brain:  1. No overt imaging evidence of intracranial hypertension. Incidental right  orbital varix. Otherwise no significant intraorbital abnormality.  2. No significant intracranial abnormality. Incidental partially empty sella.    MRV Head:  1. Normal MRV head.      ASSESSMENT:      ICD-10-CM    1. IIH (idiopathic intracranial hypertension)  G93.2 FL LUMBAR PUNCTURE DIAG     CSF Cell Count     Glucose, CSF     Culture, CSF     Protein, CSF     Culture, CSF     acetaZOLAMIDE (DIAMOX) 250 MG tablet      2. Papilledema of both eyes  H47.10 FL LUMBAR PUNCTURE DIAG     CSF Cell Count     Glucose, CSF     Culture, CSF     Protein, CSF     Culture, CSF     acetaZOLAMIDE (DIAMOX) 250 MG tablet      3. Chronic migraine with aura without status migrainosus, not intractable  G43.E09 Rimegepant Sulfate 32 MG TBDP      56 year old female with h/o HTN, OSA, depression referred for evaluation of headaches since age 81 with associated nausea, emesis, photophobia, blurred vision. H/O IIH confirmed s/p lumbar puncture (UVA) with opening pressure 52cmH2O. Recent MRI Brain/MRV were reviewed from 11/15/21 without acute intracranial pathology/mass, venous sinus stenosis/thrombosis. Neurological examination reveals b/l papilledema, otherwise non-focal. She has upcoming follow up with Ophthalmology for ongoing monitoring (Dr. Nelma Rothman request outside records for review. Will  proceed with stat lumbar puncture with opening/closing pressures which should assist with attenuation of headaches, blurred vision if related to IIH. Will start Diamox for treatment of IIH following her procedure. Advised limiting analgesic use to twice weekly to avoid rebound headache phenomenon and resuming OSA treatment, as this may contribute to headaches. Also discussed risk factors for IIH including BMI of 42 and importance of weight loss. She is aware of potential threat to vision with above diagnosis and will contact the office if any worsening headaches/vision changes.     PLAN:  Stat LP with opening/closing pressure  Start Diamox '500mg'$  BID  F/U Ophthalmology for formal visual field testing  Advised resuming OSA treatment   Trial of Nurtec '75mg'$  PRN for headache rescue therapy     Return in about 4 months (around 03/29/2022).    I have discussed the diagnosis with the patient today and the intended plan as seen in the above orders with both the patient as well as referring provider and/or PCP via electronic correspondence. The patient has received an after-visit summary and questions were answered concerning future plans. I have discussed medication side effects and warnings with the patient as well.      Etter Sjogren. Mortimer Fries, DO  Staff Neurologist  Diplomate, American Board of Psychiatry & Neurology       CC Referring provider:    Lottie Dawson, MD

## 2021-11-29 NOTE — Telephone Encounter (Signed)
Notified patient through mychart messages.

## 2021-11-29 NOTE — Telephone Encounter (Signed)
Re: Nurtec    Rcvd PA in epic-cmm, key# [id number], approval rcvd. Auth# [id number] effective 11/29/21-11/29/22, fyi to nurse.

## 2021-11-30 NOTE — Telephone Encounter (Signed)
Formatting of this note might be different from the original.  Medications proposal has been sent  Electronically signed by Earnest Rosier, RN at 11/30/2021  3:41 PM EST

## 2021-12-04 ENCOUNTER — Inpatient Hospital Stay: Admit: 2021-12-04 | Payer: BLUE CROSS/BLUE SHIELD

## 2021-12-04 DIAGNOSIS — H4710 Unspecified papilledema: Secondary | ICD-10-CM

## 2021-12-04 LAB — EXTRA TUBES HOLD

## 2021-12-04 LAB — PROTEIN, CSF
Protein, CSF: 27 MG/DL (ref 15–45)
Tube Number: 2

## 2021-12-04 LAB — CSF CELL COUNT
RBC, CSF: 0 /mm3
Tube Number, CSF: 1
WBC, CSF: 1 /mm3 (ref 0–5)

## 2021-12-04 LAB — GLUCOSE, CSF
Glucose, CSF: 59 MG/DL (ref 40–70)
Tube Number: 2

## 2021-12-04 MED ORDER — LIDOCAINE HCL (PF) 1 % IJ SOLN
1 % | Freq: Once | INTRAMUSCULAR | Status: AC
Start: 2021-12-04 — End: 2021-12-04
  Administered 2021-12-04: 19:00:00 2 mL via INTRADERMAL

## 2021-12-04 NOTE — Progress Notes (Signed)
0915: pt arrived to xray recovery via stretcher. Vital signs obtained and stable. Pt has no complaints of pain. Site on lover back checked, clean and dry with no signs of bleeding or hematoma. Pt dressed independently. Discharge instructions discussed and pt verbalized understanding. Pt wheeled out to ride at discharge area.

## 2021-12-04 NOTE — Discharge Instructions (Signed)
[facility]  Radiology Department  [phone]      Radiologist:     Date: 12/04/2021        Lumbar Puncture Discharge Instructions      Go home and rest for the next 24 - 48 hours, rest flat or in a reclined position.   Limit your activity, including  standing and walking,  to minimize post procedure complications.      Increase your fluid intake over the next 24 to 48 hours, try to minimize the use of caffeine products.  This will help your hydrate and help your body replace the CSF fluid that was removed for lab testing.      Resume your previous diet and prescribed medications.       You make take Tylenol, as directed on the label, for pain or headache.  Avoid aspirin or ibuprofen (Motrin or Advil) for the next 24 -  48 hours as it may increase your risk of bleeding.      You may shower in 24 hours.  Wash area with soap and water.  Dry well and keep covered with a band aid.  Do not soak or swim until the site is completely healed to minimize the risk of infection.      If new, severe headache occurs and does not resolve with the recommended instructions above, call your ordering doctor or seek emergency medical treatment for further evaluation.      Follow up with your referring physician as previously discussed.  All results will be sent to your ordering physician.

## 2021-12-04 NOTE — Telephone Encounter (Signed)
Called and spoke to the Pt. Per Dr. Mortimer Fries:    LP with elevated opening pressure c/w IIH. Start Diamox '500mg'$  BID as discussed.    She stated an understanding.   Also requested a Dr. Note to be out of work for today and tomorrow due to LP test completed today.

## 2021-12-04 NOTE — Telephone Encounter (Signed)
Call placed to patient.  2 identifiers received.  Advised per Dr. Mortimer Fries that the LP opening pressure was consistent with IIH.  Patient advised to start diamox '500mg'$  po bid.  Patient indicated understanding and requested a work release note for 12/04/21 and 12/05/21.

## 2021-12-04 NOTE — Telephone Encounter (Signed)
-----   Message from [facility] [place] sent at 12/04/2021  1:00 PM EST -----  LP with elevated opening pressure c/w IIH. Start Diamox '500mg'$  BID as discussed. RMD  ----- Message -----  From: Edi, [facility] Incoming Orders Results To Radiant  Sent: 12/04/2021  12:43 PM EST  To: Cynda Acres, DO

## 2021-12-05 ENCOUNTER — Ambulatory Visit: Payer: BLUE CROSS/BLUE SHIELD

## 2021-12-11 LAB — CULTURE, CSF
Culture: NO GROWTH
Gram stain: NONE SEEN
Gram stain: NONE SEEN

## 2022-01-22 ENCOUNTER — Ambulatory Visit: Payer: BLUE CROSS/BLUE SHIELD

## 2022-05-02 ENCOUNTER — Encounter: Payer: BLUE CROSS/BLUE SHIELD | Attending: Specialist

## 2022-05-03 ENCOUNTER — Ambulatory Visit: Admit: 2022-05-03 | Payer: BLUE CROSS/BLUE SHIELD

## 2022-05-03 ENCOUNTER — Ambulatory Visit: Admit: 2022-05-03 | Discharge: 2022-05-03 | Payer: BLUE CROSS/BLUE SHIELD | Attending: Specialist

## 2022-05-03 DIAGNOSIS — M1612 Unilateral primary osteoarthritis, left hip: Secondary | ICD-10-CM

## 2022-05-03 MED ORDER — METHYLPREDNISOLONE 4 MG PO TBPK
4 | PACK | ORAL | 0 refills | Status: DC
Start: 2022-05-03 — End: 2022-06-05

## 2022-05-03 NOTE — Progress Notes (Signed)
Erica Oconnor (DOB: 12/06/1965) is a 57 y.o. female, patient, here for evaluation of the following chief complaint(s):  Second Opinion (Left hip/groin pain - referred by [facility] to orthopedic surgeon, seen by [facility] was told arthritis in hip)       HPI:    Patient presents today for an opinion regarding her left hip.  She was at her baseline about 2 weeks ago.  Had relatively rapid onset of significant left hip pain.  [facility] went [facility] who referred her to orthopedics.  She saw Dr. Salvatore Marvel.  She was informed of her level of osteoarthritis and prescribed a Medrol Dosepak and an intra-articular corticosteroid injection.  She has done neither of those recommendations and her symptoms have returned back to baseline.  Symptoms localized to her groin.  They were worse with bent hip activities but also with lifting up her leg.    Allergies   Allergen Reactions    Hydromorphone Itching    Toradol [Ketorolac Tromethamine] Shortness Of Breath    Ace Inhibitors Hives and Swelling    Hydrocodone Itching       Current Outpatient Medications   Medication Sig Dispense Refill    FLUoxetine (PROZAC) 40 MG capsule Take by mouth daily      vitamin D (ERGOCALCIFEROL) 1.25 MG (50000 UT) CAPS capsule TAKE 1 CAPSULE EVERY WEEK BY ORAL ROUTE.      zolpidem (AMBIEN) 10 MG tablet TAKE 1 TABLET BY MOUTH EVERY DAY AT BEDTIME AS NEEDED FOR 30 DAYS      ENTRESTO 49-51 MG per tablet Take 1 tablet by mouth 2 times daily      nystatin (MYCOSTATIN) 100000 UNIT/GM powder Apply topically 2 times daily APPLY TO AFFECTED AREA      ibuprofen (ADVIL;MOTRIN) 600 MG tablet TAKE 1 TABLET BY MOUTH EVERY 8 HOURS AS NEEDED FOR MILD PAIN FOR UP TO 10 DAYS      hydrALAZINE (APRESOLINE) 100 MG tablet Take 1 tablet by mouth 3 times daily      ascorbic acid ([employer] VITAMIN C) 500 MG tablet Take 2 tablets by mouth daily      Iron, Ferrous Sulfate, 325 (65 Fe) MG TABS daily      Rimegepant Sulfate 75 MG TBDP Take 75 mg by mouth as needed (For migraine  rescue tx. Max 75mg /24h.) 8 tablet 5    acetaZOLAMIDE (DIAMOX) 250 MG tablet Take 2 tablets by mouth 2 times daily 120 tablet 5    irbesartan (AVAPRO) 300 MG tablet Take 1 tablet by mouth nightly      gabapentin (NEURONTIN) 300 MG capsule Take 1 capsule by mouth 3 times daily.      etodolac (LODINE XL) 400 MG extended release tablet Take 1 tablet by mouth daily      amLODIPine (NORVASC) 10 MG tablet Take 1 tablet by mouth daily      spironolactone (ALDACTONE) 50 MG tablet Take 1 tablet by mouth 2 times daily      folic acid (FOLVITE) 1 MG tablet Take 1 tablet by mouth daily      metoprolol tartrate (LOPRESSOR) 50 MG tablet Take 1 tablet by mouth 2 times daily      omeprazole (PRILOSEC) 20 MG delayed release capsule Take 1 capsule by mouth daily      aspirin 81 MG chewable tablet Take 1 tablet by mouth daily      furosemide (LASIX) 40 MG tablet Take 1 tablet by mouth daily      guanFACINE (  TENEX) 1 MG tablet Take 2 tablets by mouth nightly      rosuvastatin (CRESTOR) 10 MG tablet Take 1 tablet by mouth daily       No current facility-administered medications for this visit.       Past Medical History:   Diagnosis Date    Arthritis     Bursitis     COPD (chronic obstructive pulmonary disease) (HCC) 07/19/2016    Depression     Diastolic CHF (HCC)     GERD (gastroesophageal reflux disease)     Heart failure (HCC)     Hypertension     Morbid obesity (HCC)     Other ill-defined conditions(799.89)     Migraines, anemia    Psychiatric disorder     Depression, Anxiety    Unspecified sleep apnea     CPAP        Past Surgical History:   Procedure Laterality Date    CHOLECYSTECTOMY  2015    CT ABSCESS DRAINAGE NECK THORAX  02/23/2022    CT ABSCESS DRAINAGE NECK THORAX 02/23/2022       Family History   Problem Relation Age of Onset    Hypertension Mother     Headache Mother     Diabetes Father     Hypertension Father         Social History     Socioeconomic History    Marital status: Single     Spouse name: Not on file    Number of  children: Not on file    Years of education: Not on file    Highest education level: Not on file   Occupational History    Not on file   Tobacco Use    Smoking status: Former     Types: Cigars     Passive exposure: Current (marijuana)    Smokeless tobacco: Never   Vaping Use    Vaping Use: Never used   Substance and Sexual Activity    Alcohol use: Yes     Alcohol/week: 2.0 standard drinks of alcohol     Types: 2 Shots of liquor per week    Drug use: Yes     Frequency: 2.0 times per week     Types: Marijuana Sheran Fava)    Sexual activity: Not Currently     Partners: Male   Other Topics Concern    Not on file   Social History Narrative    Not on file     Social Determinants of Health     Financial Resource Strain: Not on file   Food Insecurity: Not on file   Transportation Needs: Not on file   Physical Activity: Not on file   Stress: Not on file   Social Connections: Not on file   Intimate Partner Violence: Not on file   Housing Stability: Not on file             Vitals:    There were no vitals filed for this visit.  Body mass index is 41.19 kg/m.    PHYSICAL EXAM:  Physical exam today reveals her hips to be relatively painless but have diminished internal rotation at 90 degrees of flexion.  Ambulates no assistive devices.  Not really limping.  Bilateral lower extremities are sensate and perfused.  Skin is intact.    IMAGING:  Xray Result (most recent):  XR HIP 2-3 VW W PELVIS LEFT 05/03/2022    Narrative  Bilateral hip x-rays were reviewed.  Patient has bilateral  hip arthroses.  I would say that the right is actually more severely involved than the left.  Right near bone-on-bone.  Large subchondral cysts and periarticular osteophytes.  Left is also very close to that.  Lumbar spine shows some degeneration as well.        ASSESSMENT/PLAN:  1. Primary osteoarthritis of left hip  -     XR HIP 2-3 VW W PELVIS LEFT; Future    Seems to be either an acute exacerbation of underlying osteoarthritis or a muscular issue.  Either  way her symptoms have resolved.  I would observe for now.  Since she is not having pain I have asked her to discontinue the intra-articular injection for now.  Prescribed her Medrol Dosepak if her symptoms recur.  Follow-up on an as-needed basis as her symptoms progress.    An electronic signature was used to authenticate this note.  --Birdena Crandall, MD

## 2022-06-05 ENCOUNTER — Encounter: Admit: 2022-06-05 | Payer: BLUE CROSS/BLUE SHIELD | Attending: Neurology

## 2022-06-05 DIAGNOSIS — G932 Benign intracranial hypertension: Secondary | ICD-10-CM

## 2022-06-05 MED ORDER — ACETAZOLAMIDE 250 MG PO TABS
250 | ORAL_TABLET | Freq: Two times a day (BID) | ORAL | 1 refills | Status: AC
Start: 2022-06-05 — End: ?

## 2022-06-05 NOTE — Progress Notes (Signed)
Neurology Clinic Follow up Note    Patient ID:  Erica Oconnor  [id number]  57 y.o.  1965/12/17      Erica Oconnor is here for follow up today of  Chief Complaint   Patient presents with    OTHER     Pt returning for H/O  IIH  Pt reports having tension/ headaches not migraines and constantly feels tired           Last Appointment With Me:  11/28/2021    "Erica Oconnor is a 57 y.o. female presenting for evaluation of headaches. She reports onset of headaches around age 9-24.   Location: Bi-frontal or over the vertex  Character: Pressure/throbbing  Intensity: On average 6/10   Frequency: Daily  # HA free days per month: 0  Duration: Several hours  Aura: Yes, visual  Associated Sx with HA: +nausea/vomiting, +phonophotophobia.   Neurological ROS: Denies focal weakness, numbness or vision loss associated with headaches. +blurred vision. +intermittent tinnitus, non-pulsatile. Prior dx IIH w/papilledema (Dr. Karolee Stamps). Prior LP with opening pressure>50cmH2O.   Systemic ROS:   +OSA untreated  Caffeine use: 4 cups daily   H/O Head trauma: None  Depressive or anxiety Sx: Yes, treated     Any change in pattern of HA? None    Triggers: Inadequate sleep. Improved when lying down or with rest  Alleviating factors: Rest/sleep, quiet   FHx HA/migraine: Mother    Treatment so far: Metoprolol, fluoxetine-ineffective. Using Fioricet daily for rescue headache treatment for the past 3 months.   Prior preventatives: Erica Oconnor Kitchen Topamax, Gabapentin, Elavil, Vit B complex, Prozac, Norvasc, Clonidine, toprol, Botox injections (effective but botox not covered by her new insurance), Diamox  Prior rescue: Relpax, Fioricet, Imitrex, hydrocodone    Investigations so far:   11/15/21 MRI Brain/MRV no acute pathology, no VST/significant venous sinus stenosis"  Interval History:   Headaches have improved since last visit, still near daily in latter afternoon but less severe and more "tension based" per pt over frontal region without nausea, photophobia. She uses  tylenol daily for above headaches with relief.   Admits to blurred vision, unchanged since her last visit. No vision loss. Also has persistent tinnitus, non-pulsatile, L>R.   She is overdue to f/u with Dr. Karolee Stamps.   Taking Diamox 500mg  BID as prescribed. No apparent side effects.   She is not treating OSA at this time-needs CPAP adjusted with recurrent sleep study pending for 12/2022.   She has lost nearly 15 lbs since last visit!    PMHx/ PSHx/ FHx/ SHx:  Reviewed and unchanged previous visit.   Past Medical History:   Diagnosis Date    Arthritis     Bursitis     COPD (chronic obstructive pulmonary disease) (HCC) 07/19/2016    Depression     Diastolic CHF (HCC)     GERD (gastroesophageal reflux disease)     Heart failure (HCC)     Hypertension     Morbid obesity (HCC)     Other ill-defined conditions(799.89)     Migraines, anemia    Psychiatric disorder     Depression, Anxiety    Unspecified sleep apnea     CPAP       ROS:  Comprehensive review of systems negative except for as noted above.       Objective:       Meds:  Current Outpatient Medications   Medication Sig Dispense Refill    FLUoxetine (PROZAC) 40 MG capsule Take 2 capsules by mouth daily  zolpidem (AMBIEN) 10 MG tablet TAKE 1 TABLET BY MOUTH EVERY DAY AT BEDTIME AS NEEDED FOR 30 DAYS      ENTRESTO 49-51 MG per tablet Take 0.5 tablets by mouth 2 times daily      hydrALAZINE (APRESOLINE) 100 MG tablet Take 1 tablet by mouth 3 times daily      Rimegepant Sulfate 75 MG TBDP Take 75 mg by mouth as needed (For migraine rescue tx. Max 75mg /24h.) 8 tablet 5    acetaZOLAMIDE (DIAMOX) 250 MG tablet Take 2 tablets by mouth 2 times daily 120 tablet 5    gabapentin (NEURONTIN) 300 MG capsule Take 1 capsule by mouth 3 times daily.      spironolactone (ALDACTONE) 50 MG tablet Take 1 tablet by mouth 2 times daily      metoprolol tartrate (LOPRESSOR) 50 MG tablet Take 1 tablet by mouth 2 times daily      omeprazole (PRILOSEC) 20 MG delayed release capsule Take 2  capsules by mouth daily as needed      aspirin 81 MG chewable tablet Take 1 tablet by mouth daily      furosemide (LASIX) 40 MG tablet Take 1 tablet by mouth daily      rosuvastatin (CRESTOR) 10 MG tablet Take 1 tablet by mouth daily       No current facility-administered medications for this visit.       Exam:  BP 128/70   Pulse 61   Ht 1.702 m (5\' 7" )   Wt 115.7 kg (255 lb)   SpO2 97%   BMI 39.94 kg/m   NEUROLOGICAL EXAM:  General: Awake, alert, speech fluent  CN: B/L papilledema L>R, PERRL, EOMI without nystagmus, VFF to confrontation, facial sensation and strength are normal and symmetric, hearing is intact to finger rub bilaterally, palate and tongue movements are intact and symmetric.  Motor: Normal tone, bulk and strength bilaterally.  Reflexes: 2/4 and symmetric, plantar stimulation is flexor.  Coordination: FNF, RAM, HTS intact.  Sensation: LT intact throughout.  Gait: Normal-based and steady.      [facility] Outpatient Visit on 12/04/2021   Component Date Value    Tube Number, CSF 12/04/2021 1     Color, CSF 12/04/2021 COLORLESS     CSF Appearance 12/04/2021 CLEAR     RBC, CSF 12/04/2021 0     WBC, CSF 12/04/2021 1     Tube Number 12/04/2021 2     Glucose, CSF 12/04/2021 59     Special Requests 12/04/2021 NO SPECIAL REQUESTS     Gram Stain 12/04/2021 NO WBC'S SEEN     Gram Stain 12/04/2021 NO ORGANISMS SEEN     Culture 12/04/2021 NO GROWTH 7 DAYS     Tube Number 12/04/2021 2     Protein, CSF 12/04/2021 27     Specimen HOld 12/04/2021 CSF TUBE     Comment: 12/04/2021 Add-on orders for these samples will be processed based on acceptable specimen integrity and analyte stability, which may vary by analyte.        IMAGING:  MRI Result (most recent):  MRI BRAIN W WO CONTRAST 11/15/2021    Narrative  EXAM:  MRI BRAIN W WO CONTRAST, MRV HEAD WO CONTRAST    INDICATION:    Unspecified papilledema    COMPARISON:  None.    CONTRAST: 20 ml ProHance.    TECHNIQUE:  Multiplanar multisequence acquisition without  and with contrast of the brain and  orbits.  Time-of-flight noncontrast MRV of the brain was  performed. Multiplanar and MIP  reconstructions were obtained.    FINDINGS:  MRI Brain:  The globes are normal. In the posterior inferior intraconal fat of the right  orbit between the optic nerve sheath and inferior rectus muscle, there is a  tubular STIR hyperintense, nonenhancing lesion which extends towards the orbital  apex, consistent with a varix. The optic nerves are normal in size and signal.  There is no significant enlargement of the optic nerve sheaths. The extraocular  muscles are normal. No other evidence of intraorbital mass or abnormal  intraorbital enhancement. The cavernous sinuses are unremarkable.    The ventricles are normal in size and position. The brain parenchyma has normal  signal characteristics for age, with minimal tiny scattered subcortical and deep  white matter T2/FLAIR hyperintensities that are of no clinical significance.  Incidental partially empty sella. There is no acute infarct, hemorrhage,  extra-axial fluid collection, or mass effect. There is no cerebellar tonsillar  herniation. Expected arterial flow-voids are present. No evidence of abnormal  enhancement.    Small retention cysts in bilateral maxillary sinuses. The mastoid air cells and  middle ears are clear. No significant osseous or scalp lesions are identified.    MRV Head:  The dural venous sinuses and deep cerebral veins are patent without evidence of  thrombosis. Codominant transverse sinuses, with no significant transverse sinus  stenosis.    Impression  MRI Brain:  1. No overt imaging evidence of intracranial hypertension. Incidental right  orbital varix. Otherwise no significant intraorbital abnormality.  2. No significant intracranial abnormality. Incidental partially empty sella.    MRV Head:  1. Normal MRV head.      MRI PELVIS W WO CONTRAST 11/15/2021    Narrative  MRI OF THE PELVIS WITH AND WITHOUT  CONTRAST.    INDICATION: Uterine fibroids. Prolapsing uterine mass/fibroid.    COMPARISON: None.    TECHNIQUE: Multisequence and multiplanar MRI of the pelvis was performed before  and after the administration of 20 cc IV gadoteridol (ProHance).    FINDINGS:  The uterus is enlarged and replaced with multiple fibroids. It measures 130 x 98  x 206 mm in greatest dimensions, and extends to the level of the L3 inferior  endplate, above the umbilicus. There is no visible endometrium or junctional  zone.    The largest fibroid is in the cervix and/or lower uterine segment. It measures  94 x 100 x 115 mm. It fills the inferior pelvis mediolaterally, with mass effect  and narrowing of the right greater than left common iliac veins (20-32),  anterior displacement and compression of the bladder (21-20), and splaying of  the vaginal fornices (21-20).    No pelvic lymphadenopathy. There is trace free fluid. The visualized portions of  the bowel are normal.    Impression  1. Uterus enlarged and replaced with fibroids.  2. Large cervical/lower uterine segment fibroid, with mass effect as described  above.      MRV BRAIN WO CONTRAST 11/15/2021    Narrative  EXAM:  MRI BRAIN W WO CONTRAST, MRV HEAD WO CONTRAST    INDICATION:    Unspecified papilledema    COMPARISON:  None.    CONTRAST: 20 ml ProHance.    TECHNIQUE:  Multiplanar multisequence acquisition without and with contrast of the brain and  orbits.  Time-of-flight noncontrast MRV of the brain was performed. Multiplanar and MIP  reconstructions were obtained.    FINDINGS:  MRI Brain:  The globes are normal. In the posterior  inferior intraconal fat of the right  orbit between the optic nerve sheath and inferior rectus muscle, there is a  tubular STIR hyperintense, nonenhancing lesion which extends towards the orbital  apex, consistent with a varix. The optic nerves are normal in size and signal.  There is no significant enlargement of the optic nerve sheaths. The  extraocular  muscles are normal. No other evidence of intraorbital mass or abnormal  intraorbital enhancement. The cavernous sinuses are unremarkable.    The ventricles are normal in size and position. The brain parenchyma has normal  signal characteristics for age, with minimal tiny scattered subcortical and deep  white matter T2/FLAIR hyperintensities that are of no clinical significance.  Incidental partially empty sella. There is no acute infarct, hemorrhage,  extra-axial fluid collection, or mass effect. There is no cerebellar tonsillar  herniation. Expected arterial flow-voids are present. No evidence of abnormal  enhancement.    Small retention cysts in bilateral maxillary sinuses. The mastoid air cells and  middle ears are clear. No significant osseous or scalp lesions are identified.    MRV Head:  The dural venous sinuses and deep cerebral veins are patent without evidence of  thrombosis. Codominant transverse sinuses, with no significant transverse sinus  stenosis.    Impression  MRI Brain:  1. No overt imaging evidence of intracranial hypertension. Incidental right  orbital varix. Otherwise no significant intraorbital abnormality.  2. No significant intracranial abnormality. Incidental partially empty sella.    MRV Head:  1. Normal MRV head.        Assessment:       ICD-10-CM    1. IIH (idiopathic intracranial hypertension)  G93.2 acetaZOLAMIDE (DIAMOX) 250 MG tablet      2. Papilledema of both eyes  H47.10 acetaZOLAMIDE (DIAMOX) 250 MG tablet      57 y.o. female with h/o HTN, OSA, depression returning for f/u of headaches since age 71 with associated nausea, emesis, photophobia, blurred vision. H/O IIH confirmed s/p lumbar puncture (UVA) with opening pressure 52cmH2O. Recent MRI Brain/MRV were reviewed from 11/15/21 without acute intracranial pathology/mass, venous sinus stenosis/thrombosis. Neurological examination reveals b/l papilledema L>R, otherwise non-focal. She is followed by Ophthalmology for  ongoing monitoring (Dr. Karolee Stamps).   Lumbar puncture repeated 11/2021 with opening pressure 35cm H2O c/w IIH. Discussed risk factors for IIH including BMI of 42 at initial visit and importance of weight loss. She has successfully lost 14 lbs since her last visit. She will continue Diamox as prescribed for treatment of IIH. Denies worsening vision/vision loss. Headaches are still daily but improved in severity. Suspect a component of medication overuse headache from daily analgesic use. She is aware of potential threat to vision with above diagnosis and will contact the office if any worsening headaches/vision changes.   Plan:   Continue Diamox 500mg  BID for IIH  Advised continued weight loss  Resume CPAP for OSA when able  Ophthalmology f/u for formal visual field testing and re-evaluation of papilledema  Limit use of OTC analgesics for headaches-may start Mg supplementation 400mg  daily for HA ppx      Return in about 6 months (around 12/06/2022).    I have discussed the diagnosis with the patient today and the intended plan as seen in the above orders with both the patient as well as referring provider and/or PCP via electronic correspondence. The patient has received an after-visit summary and questions were answered concerning future plans. I have discussed medication side effects and warnings with the patient as well.  Signed:  Christel Mormon, DO  06/05/2022  2:46 PM

## 2022-06-05 NOTE — Progress Notes (Signed)
Faxed office visit notes to Dr. Karolee Stamps received confirmation it was sent successfully

## 2022-07-11 NOTE — Telephone Encounter (Signed)
Called pt, verified DOB. Pt states for 2 weeks she has been having really bad fatigue, tension/possible stress headaches, aching in her back, upper back pain, constant aching in both legs, swelling in feet and ankles bilaterally. She stated that these symptoms are not new but says they are getting worse. Pt stated it's becoming hard for her to do daily tasks even at work due to her symptoms and says she has to sit and take breaks when walking. She says she took some Excedrin for her headaches and said that it helps. Pt stated that she was going to try some stretching exercises as well to help her symptoms.    Pt states she does have some SOB at times and says it's nothing new but states that it does become worse at times when walking due to pain and due to her work environment.    Pt denies chest pain, dizziness, discoloration, SOB at this time.    Pt requested a sooner appt, I verbalized to pt that we didn't have any sooner appts until 07/17/2022 with another provider, pt declined and stated she would rather only see Dr. Alease Frame.    I rec pt to go to urgent care, pt verbalized understanding. I asked pt if she was going to urgent care today and she stated that she will try to go today or tomorrow. I offered pt a list of Urgent Cares around her area, pt agreed and asked if I could send her the list via MyChart.     I gave ER precautions, pt verbalized understanding.    I reminded pt of appt on 07/20/2022 with Dr. Alease Frame

## 2022-07-12 NOTE — Telephone Encounter (Signed)
Thank you agree patients needs evaluation

## 2022-07-13 ENCOUNTER — Inpatient Hospital Stay
Admit: 2022-07-13 | Discharge: 2022-07-14 | Disposition: A | Discharge status: Home / Self Care | Payer: BLUE CROSS/BLUE SHIELD

## 2022-07-13 DIAGNOSIS — M2550 Pain in unspecified joint: Secondary | ICD-10-CM

## 2022-07-13 DIAGNOSIS — G2581 Restless legs syndrome: Secondary | ICD-10-CM

## 2022-07-13 LAB — COVID-19 & INFLUENZA COMBO
Rapid Influenza A By PCR: NOT DETECTED
Rapid Influenza B By PCR: NOT DETECTED
SARS-CoV-2, PCR: NOT DETECTED

## 2022-07-13 NOTE — ED Provider Notes (Incomplete)
[facility]  EMERGENCY DEPARTMENT ENCOUNTER      Pt Name: Erica Oconnor  MRN: [id number]  Birthdate 1965-12-13  Date of evaluation: 07/13/2022  Provider: Alice Reichert, MD    CHIEF COMPLAINT       Chief Complaint   Patient presents with   . Joint Pain         HISTORY OF PRESENT ILLNESS   (Location/Symptom, Timing/Onset, Context/Setting, Quality, Duration, Modifying Factors, Severity)  Note limiting factors.   HPI    57 year old female with past medical history significant for chronic systolic heart failure, hypertension, idiopathic intracranial hypertension, and restless leg syndrome presents to ED with complaint of several weeks of noted systemic symptoms including malaise, myalgias, arthralgias, with chronic dyspnea.  She attributes this to stress from work.  She reports she has been sleeping throughout the night, but has not felt rested.  She has not had any recent change of medications.  She denies fevers, headache, vision changes.  She has contacted PCP, has appointment for 7/5.  She was urged to present to ED for further evaluation.    Review of External Medical Records:   Neurology note 5/21  Cardiology note 4/29      Nursing Notes were reviewed.    REVIEW OF SYSTEMS    (2-9 systems for level 4, 10 or more for level 5)     Review of Systems    Except as noted above the remainder of the review of systems was reviewed and negative.       PAST MEDICAL HISTORY     Past Medical History:   Diagnosis Date   . Arthritis    . Bursitis    . COPD (chronic obstructive pulmonary disease) (HCC) 07/19/2016   . Depression    . Diastolic CHF (HCC)    . GERD (gastroesophageal reflux disease)    . Heart failure (HCC)    . Hypertension    . Morbid obesity (HCC)    . Other ill-defined conditions(799.89)     Migraines, anemia   . Psychiatric disorder     Depression, Anxiety   . Unspecified sleep apnea     CPAP         SURGICAL HISTORY       Past Surgical History:   Procedure Laterality Date   . CHOLECYSTECTOMY  2015   .  CT ABSCESS DRAINAGE NECK THORAX  02/23/2022    CT ABSCESS DRAINAGE NECK THORAX 02/23/2022   . RADICAL HYSTERECTOMY           CURRENT MEDICATIONS       Previous Medications    ACETAZOLAMIDE (DIAMOX) 250 MG TABLET    Take 2 tablets by mouth 2 times daily    ASPIRIN 81 MG CHEWABLE TABLET    Take 1 tablet by mouth daily    ENTRESTO 49-51 MG PER TABLET    Take 0.5 tablets by mouth 2 times daily    FLUOXETINE (PROZAC) 40 MG CAPSULE    Take 2 capsules by mouth daily    FUROSEMIDE (LASIX) 40 MG TABLET    Take 1 tablet by mouth daily    GABAPENTIN (NEURONTIN) 300 MG CAPSULE    Take 1 capsule by mouth 3 times daily.    HYDRALAZINE (APRESOLINE) 100 MG TABLET    Take 1 tablet by mouth 3 times daily    METOPROLOL TARTRATE (LOPRESSOR) 50 MG TABLET    Take 1 tablet by mouth 2 times daily    OMEPRAZOLE (PRILOSEC) 20 MG DELAYED  RELEASE CAPSULE    Take 2 capsules by mouth daily as needed    RIMEGEPANT SULFATE 75 MG TBDP    Take 75 mg by mouth as needed (For migraine rescue tx. Max 75mg /24h.)    ROSUVASTATIN (CRESTOR) 10 MG TABLET    Take 1 tablet by mouth daily    SPIRONOLACTONE (ALDACTONE) 50 MG TABLET    Take 1 tablet by mouth 2 times daily    ZOLPIDEM (AMBIEN) 10 MG TABLET    TAKE 1 TABLET BY MOUTH EVERY DAY AT BEDTIME AS NEEDED FOR 30 DAYS       ALLERGIES     Hydromorphone, Toradol [ketorolac tromethamine], Ace inhibitors, and Hydrocodone    FAMILY HISTORY       Family History   Problem Relation Age of Onset   . Hypertension Mother    . Headache Mother    . Diabetes Father    . Hypertension Father           SOCIAL HISTORY       Social History     Socioeconomic History   . Marital status: Single     Spouse name: None   . Number of children: None   . Years of education: None   . Highest education level: None   Tobacco Use   . Smoking status: Former     Types: Cigars     Passive exposure: Past (marijuana)   . Smokeless tobacco: Never   Vaping Use   . Vaping Use: Never used   Substance and Sexual Activity   . Alcohol use: Yes      Alcohol/week: 2.0 standard drinks of alcohol     Types: 2 Shots of liquor per week     Comment: once per week   . Drug use: Yes     Frequency: 2.0 times per week     Types: Marijuana (Weed)   . Sexual activity: Not Currently     Partners: Male           PHYSICAL EXAM    (up to 7 for level 4, 8 or more for level 5)     ED Triage Vitals [07/13/22 1904]   BP Temp Temp Source Pulse Respirations SpO2 Height Weight - Scale   (!) 141/67 97.3 F (36.3 C) Tympanic 59 16 98 % 1.702 m (5\' 7" ) 120.8 kg (266 lb 5.1 oz)       Body mass index is 41.71 kg/m.    Physical Exam  Vitals and nursing note reviewed.   Constitutional:       General: She is not in acute distress.     Appearance: Normal appearance. She is not ill-appearing or toxic-appearing.   HENT:      Head: Normocephalic and atraumatic.      Mouth/Throat:      Mouth: Mucous membranes are moist.   Cardiovascular:      Rate and Rhythm: Normal rate and regular rhythm.      Pulses: Normal pulses.      Heart sounds: Normal heart sounds.   Pulmonary:      Effort: Pulmonary effort is normal. No respiratory distress.      Breath sounds: Normal breath sounds.   Abdominal:      General: Abdomen is flat. There is no distension.   Musculoskeletal:         General: No swelling, tenderness, deformity or signs of injury.      Cervical back: Normal range of motion and neck supple.  Right lower leg: No edema.      Left lower leg: No edema.   Skin:     General: Skin is warm and dry.      Capillary Refill: Capillary refill takes less than 2 seconds.   Neurological:      General: No focal deficit present.      Mental Status: She is alert and oriented to person, place, and time. Mental status is at baseline.         DIAGNOSTIC RESULTS     EKG:   All EKG's are interpreted by an Emergency Department Physician who either signs or Co-signs this chart in the absence of a cardiologist. Interpretation provided in ED course below    RADIOLOGY:   Non-plain film images such as CT, Ultrasound and  MRI are read by the radiologist. Plain radiographic images are visualized and preliminarily interpreted by the emergency physician with the findings as noted in the ED course.    Interpretation per the Radiologist below, if available at the time of this note:    No orders to display        LABS:  Labs Reviewed   COVID-19 & INFLUENZA COMBO   CBC WITH AUTO DIFFERENTIAL   COMPREHENSIVE METABOLIC PANEL   TSH + FREE T4 PANEL   MAGNESIUM       All other labs were within normal range or not returned as of this dictation.    EMERGENCY DEPARTMENT COURSE and DIFFERENTIAL DIAGNOSIS/MDM:   Vitals:    Vitals:    07/13/22 1904 07/13/22 2100 07/13/22 2130   BP: (!) 141/67 116/77 124/72   Pulse: 59     Resp: 16     Temp: 97.3 F (36.3 C)     TempSrc: Tympanic     SpO2: 98% 97% 99%   Weight: 120.8 kg (266 lb 5.1 oz)     Height: 1.702 m (5\' 7" )             Medical Decision Making  57 year old female presents ED with fatigue, myalgias, arthralgias, as well as chronic headaches and dyspnea.  She denies recent illness, does report increased stress.  She is afebrile and nontoxic on exam.  She does not appear hypervolemic.  She is neuro intact.  Discussed with patient plan for lab workup, symptomatic management, and follow-up with PCP.    Amount and/or Complexity of Data Reviewed  External Data Reviewed: notes.  Labs: ordered. Decision-making details documented in ED Course.  ECG/medicine tests: ordered.        ED Course as of 07/13/22 2213   Fri Jul 13, 2022   2207 WBC: 5.9 [RS]   2207 Hemoglobin Quant(!): 11.1 [RS]   2212 EKG obtained at 1926. Per my read, noted with sinus rhythm at a rate of 52. Normal axis and intervals. Narrow complex QRS. No ST changes noted. No ectopy or ischemia noted.   [RS]      ED Course User Index  [RS] Alice Reichert, MD           CONSULTS:  None    PROCEDURES:  Unless otherwise noted below, none     Procedures    MEDS:  Medications - No data to display        FINAL IMPRESSION    No diagnosis found.       DISPOSITION/PLAN   DISPOSITION           PATIENT REFERRED TO:  No follow-up provider specified.    DISCHARGE MEDICATIONS:  New Prescriptions    No medications on file         (Please note that portions of this note were completed with a voice recognition program.  Efforts were made to edit the dictations but occasionally words are mis-transcribed.)    Michaeal Davis Mathis Bud, MD (electronically signed)  Emergency Attending Physician

## 2022-07-13 NOTE — ED Notes (Signed)
[facility] Nursing Note        Patient Name: Erica Oconnor      DOB: 1965-03-30             MRN: [id number]      Chief Complaint: Joint Pain      Admit Diagnosis: No admission diagnoses are documented for this encounter.      Surgery: * No surgery found *            MD/RN reviewed discharge instructions and options with patient. Patient verbalized understanding. RN reviewed discharge instructions using teach back method. Patient ambulatory to exit without difficulty and no acute signs of distress. No complaints or needs expressed at this time. Patient counseled on medications prescribed at discharge (If prescribed). Vital signs stable. Patient to follow up with PCP/Specialist on the next business day for appointment. All questions answered by ER RN.          Lines:        Vitals: Patient Vitals for the past 12 hrs:   Temp Pulse Resp BP SpO2   07/13/22 2245 97.5 F (36.4 C) 61 18 125/68 96 %   07/13/22 2200 -- -- -- (!) 122/50 98 %   07/13/22 2130 -- -- -- 124/72 99 %   07/13/22 2100 -- -- -- 116/77 97 %   07/13/22 1904 97.3 F (36.3 C) 59 16 (!) 141/67 98 %         Signed by: Alejandro Mulling, RN, MBA, BSN, CMSRN                                              07/13/2022 at 10:50 PM

## 2022-07-13 NOTE — ED Triage Notes (Signed)
Patient reports shortness of breath, joint pain and chills. Joint pain has been ongoing for several weeks. Shortness of breath began today and is associated with activity.

## 2022-07-13 NOTE — Discharge Instructions (Addendum)
Your labs appear reassuring for now. You can follow up results further in MyChart. We will treat your symptoms for now.    Follow up with your primary care as planned. If you have worsening symptoms, return to ER.

## 2022-07-14 LAB — CBC WITH AUTO DIFFERENTIAL
Basophils %: 1 % (ref 0–1)
Basophils Absolute: 0 10*3/uL (ref 0.0–0.1)
Eosinophils %: 4 % (ref 0–7)
Eosinophils Absolute: 0.3 10*3/uL (ref 0.0–0.4)
Hematocrit: 36 % (ref 35.0–47.0)
Hemoglobin: 11.1 g/dL — ABNORMAL LOW (ref 11.5–16.0)
Immature Granulocytes %: 0 % (ref 0.0–0.5)
Immature Granulocytes Absolute: 0 10*3/uL (ref 0.00–0.04)
Lymphocytes %: 30 % (ref 12–49)
Lymphocytes Absolute: 1.7 10*3/uL (ref 0.8–3.5)
MCH: 29.1 PG (ref 26.0–34.0)
MCHC: 30.8 g/dL (ref 30.0–36.5)
MCV: 94.5 FL (ref 80.0–99.0)
MPV: 11.2 FL (ref 8.9–12.9)
Monocytes %: 9 % (ref 5–13)
Monocytes Absolute: 0.5 10*3/uL (ref 0.0–1.0)
Neutrophils %: 56 % (ref 32–75)
Neutrophils Absolute: 3.3 10*3/uL (ref 1.8–8.0)
Nucleated RBCs: 0 PER 100 WBC
Platelets: 209 10*3/uL (ref 150–400)
RBC: 3.81 M/uL (ref 3.80–5.20)
RDW: 13.6 % (ref 11.5–14.5)
WBC: 5.9 10*3/uL (ref 3.6–11.0)
nRBC: 0 10*3/uL (ref 0.00–0.01)

## 2022-07-14 LAB — EKG 12-LEAD
Atrial Rate: 52 {beats}/min
P Axis: 26 degrees
P-R Interval: 198 ms
Q-T Interval: 400 ms
QRS Duration: 80 ms
QTc Calculation (Bazett): 372 ms
R Axis: 5 degrees
T Axis: 3 degrees
Ventricular Rate: 52 {beats}/min

## 2022-07-14 LAB — COMPREHENSIVE METABOLIC PANEL
ALT: 13 U/L (ref 12–78)
AST: 11 U/L — ABNORMAL LOW (ref 15–37)
Albumin/Globulin Ratio: 0.9 — ABNORMAL LOW (ref 1.1–2.2)
Albumin: 3.3 g/dL — ABNORMAL LOW (ref 3.5–5.0)
Alk Phosphatase: 68 U/L (ref 45–117)
Anion Gap: 9 mmol/L (ref 5–15)
BUN/Creatinine Ratio: 21 — ABNORMAL HIGH (ref 12–20)
BUN: 24 MG/DL — ABNORMAL HIGH (ref 6–20)
CO2: 24 mmol/L (ref 21–32)
Calcium: 8.5 MG/DL (ref 8.5–10.1)
Chloride: 108 mmol/L (ref 97–108)
Creatinine: 1.16 MG/DL — ABNORMAL HIGH (ref 0.55–1.02)
Est, Glom Filt Rate: 56 mL/min/{1.73_m2} — ABNORMAL LOW (ref >60)
Globulin: 3.6 g/dL (ref 2.0–4.0)
Glucose: 101 mg/dL — ABNORMAL HIGH (ref 65–100)
Potassium: 4.1 mmol/L (ref 3.5–5.1)
Sodium: 141 mmol/L (ref 136–145)
Total Bilirubin: 0.2 MG/DL (ref 0.2–1.0)
Total Protein: 6.9 g/dL (ref 6.4–8.2)

## 2022-07-14 LAB — MAGNESIUM: Magnesium: 2.4 mg/dL (ref 1.6–2.4)

## 2022-07-14 MED ORDER — CLONAZEPAM 0.5 MG PO TABS
0.5 MG | ORAL_TABLET | Freq: Every evening | ORAL | 0 refills | Status: AC | PRN
Start: 2022-07-14 — End: 2022-07-20

## 2022-07-14 MED ORDER — MELOXICAM 7.5 MG PO TABS
7.5 MG | ORAL_TABLET | Freq: Every day | ORAL | 0 refills | Status: AC | PRN
Start: 2022-07-14 — End: 2022-07-23

## 2022-07-15 LAB — TSH + FREE T4 PANEL
T4 Free: 1 NG/DL (ref 0.8–1.5)
TSH, 3rd Generation: 0.81 u[IU]/mL (ref 0.36–3.74)

## 2022-08-22 ENCOUNTER — Inpatient Hospital Stay
Admit: 2022-08-22 | Discharge: 2022-08-22 | Disposition: A | Discharge status: Home / Self Care | Payer: BLUE CROSS/BLUE SHIELD | Attending: Emergency Medicine

## 2022-08-22 DIAGNOSIS — R040 Epistaxis: Secondary | ICD-10-CM

## 2022-08-22 MED ORDER — SILVER NITRATE-POT NITRATE 75-25 % EX MISC
75-25 | CUTANEOUS | Status: AC
Start: 2022-08-22 — End: 2022-08-22
  Administered 2022-08-22: 18:00:00 1 via TOPICAL

## 2022-08-22 MED FILL — SILVER NITRATE-POT NITRATE 75-25 % EX MISC: 75-25 % | CUTANEOUS | Qty: 1

## 2022-08-22 NOTE — ED Triage Notes (Signed)
Pt c/o nosebleeds since Friday. Pt has already had site cauterized since it started. No hx of same

## 2022-08-22 NOTE — ED Notes (Signed)
Patient stable at time of discharge. Reviewed discharge instructions, follow up, and home care with patient. Allowed time for questions. Patient verbalized understanding. Ambulatory out of department with steady gait unaccompanied.

## 2022-08-22 NOTE — ED Provider Notes (Shared)
[facility]  EMERGENCY DEPARTMENT ENCOUNTER      Pt Name: Erica Oconnor  MRN: [id number]  Birthdate 06/01/1965  Date of evaluation: 08/22/2022  Provider: Ellsworth Lennox, DO    CHIEF COMPLAINT       Chief Complaint   Patient presents with    Epistaxis         HISTORY OF PRESENT ILLNESS    HPI    Erica Oconnor is a 57 y.o. female with a history listed below who presents to the emergency department for evaluation of nosebleeds.  Patient reports episodic left-sided nosebleeds since Friday.  She was seen at urgent care yesterday states that they performed cauterization but very shortly after leaving bleeding returned.  She is able to control the bleeding with pressure but then they returned shortly afterwards.  Notes last week she did have upper respiratory illness but that has since improved.  Denies any trauma to the nose.    Nursing Notes were reviewed.    REVIEW OF SYSTEMS       Review of Systems   Constitutional:  Negative for fever.   HENT:  Positive for congestion and nosebleeds.    Neurological:  Negative for speech difficulty.           PAST MEDICAL HISTORY     Past Medical History:   Diagnosis Date    Arthritis     Bursitis     COPD (chronic obstructive pulmonary disease) (HCC) 07/19/2016    Depression     Diastolic CHF (HCC)     GERD (gastroesophageal reflux disease)     Heart failure (HCC)     Hypertension     Morbid obesity (HCC)     Other ill-defined conditions(799.89)     Migraines, anemia    Psychiatric disorder     Depression, Anxiety    Unspecified sleep apnea     CPAP         SURGICAL HISTORY       Past Surgical History:   Procedure Laterality Date    CHOLECYSTECTOMY  2015    CT ABSCESS DRAINAGE NECK THORAX  02/23/2022    CT ABSCESS DRAINAGE NECK THORAX 02/23/2022    RADICAL HYSTERECTOMY           CURRENT MEDICATIONS       Previous Medications    ACETAZOLAMIDE (DIAMOX) 250 MG TABLET    Take 2 tablets by mouth 2 times daily    ASPIRIN 81 MG CHEWABLE TABLET    Take 1 tablet by mouth daily     CLONAZEPAM (KLONOPIN) 0.5 MG TABLET    Take 1 tablet by mouth nightly as needed (sleep/restless legs) for up to 7 days. Max Daily Amount: 0.5 mg    ENTRESTO 49-51 MG PER TABLET    Take 0.5 tablets by mouth 2 times daily    FLUOXETINE (PROZAC) 40 MG CAPSULE    Take 2 capsules by mouth daily    FUROSEMIDE (LASIX) 40 MG TABLET    Take 1 tablet by mouth daily    GABAPENTIN (NEURONTIN) 300 MG CAPSULE    Take 1 capsule by mouth 3 times daily.    HYDRALAZINE (APRESOLINE) 100 MG TABLET    Take 1 tablet by mouth 3 times daily    MELOXICAM (MOBIC) 7.5 MG TABLET    Take 1-2 tablets by mouth daily as needed (pain/fever)    METOPROLOL TARTRATE (LOPRESSOR) 50 MG TABLET    Take 1 tablet by mouth 2 times daily  OMEPRAZOLE (PRILOSEC) 20 MG DELAYED RELEASE CAPSULE    Take 2 capsules by mouth daily as needed    RIMEGEPANT SULFATE 75 MG TBDP    Take 75 mg by mouth as needed (For migraine rescue tx. Max 75mg /24h.)    ROSUVASTATIN (CRESTOR) 10 MG TABLET    Take 1 tablet by mouth daily    SPIRONOLACTONE (ALDACTONE) 50 MG TABLET    Take 1 tablet by mouth 2 times daily    WEGOVY 0.25 MG/0.5ML SOAJ SC INJECTION    Inject 0.25 mg into the skin every 7 days    ZOLPIDEM (AMBIEN) 10 MG TABLET    TAKE 1 TABLET BY MOUTH EVERY DAY AT BEDTIME AS NEEDED FOR 30 DAYS       ALLERGIES     Hydromorphone, Toradol [ketorolac tromethamine], Ace inhibitors, and Hydrocodone    FAMILY HISTORY       Family History   Problem Relation Age of Onset    Hypertension Mother     Headache Mother     Diabetes Father     Hypertension Father           SOCIAL HISTORY       Social History     Socioeconomic History    Marital status: Single     Spouse name: None    Number of children: None    Years of education: None    Highest education level: None   Tobacco Use    Smoking status: Former     Types: Cigars     Passive exposure: Past (marijuana)    Smokeless tobacco: Never   Vaping Use    Vaping Use: Never used   Substance and Sexual Activity    Alcohol use: Yes      Alcohol/week: 2.0 standard drinks of alcohol     Types: 2 Shots of liquor per week     Comment: once per week    Drug use: Yes     Frequency: 2.0 times per week     Types: Marijuana Sheran Fava)    Sexual activity: Not Currently     Partners: Male           PHYSICAL EXAM       ED Triage Vitals [08/22/22 1400]   BP Temp Temp Source Pulse Respirations SpO2 Height Weight - Scale   (!) 148/84 97.5 F (36.4 C) Tympanic 71 19 98 % 1.702 m (5\' 7" ) 119.2 kg (262 lb 12.6 oz)       Body mass index is 41.16 kg/m.    Physical Exam  Vitals and nursing note reviewed.   Constitutional:       General: She is not in acute distress.     Appearance: Normal appearance. She is not ill-appearing or toxic-appearing.   HENT:      Head: Normocephalic and atraumatic.      Nose: Congestion present.      Right Nostril: No epistaxis.      Comments: Dried blood in the left nostril with friable tissue along the septum  Eyes:      General: No scleral icterus.        Right eye: No discharge.         Left eye: No discharge.      Conjunctiva/sclera: Conjunctivae normal.   Cardiovascular:      Rate and Rhythm: Normal rate.   Pulmonary:      Effort: Pulmonary effort is normal. No respiratory distress.   Musculoskeletal:  General: Normal range of motion.      Cervical back: Normal range of motion.   Skin:     General: Skin is warm and dry.      Capillary Refill: Capillary refill takes less than 2 seconds.   Neurological:      General: No focal deficit present.      Mental Status: She is alert.   Psychiatric:         Mood and Affect: Mood normal.         Behavior: Behavior normal.         DIAGNOSTIC RESULTS     EKG: All EKG's are interpreted by the Emergency Department Physician who either signs or Co-signs this chart in the absence of a cardiologist.         RADIOLOGY:   Non-plain film images such as CT, Ultrasound and MRI are read by the radiologist. Plain radiographic images are visualized and preliminarily interpreted by the emergency physician  with the below findings:        Interpretation per the Radiologist below, if available at the time of this note:    No orders to display        LABS:  Labs Reviewed - No data to display    All other labs were within normal range or not returned as of this dictation.    EMERGENCY DEPARTMENT COURSE and DIFFERENTIAL DIAGNOSIS/MDM:   Vitals:    Vitals:    08/22/22 1400   BP: (!) 148/84   Pulse: 71   Resp: 19   Temp: 97.5 F (36.4 C)   TempSrc: Tympanic   SpO2: 98%   Weight: 119.2 kg (262 lb 12.6 oz)   Height: 1.702 m (5\' 7" )           Medical Decision Making  Risk  Prescription drug management.        CONSULTS:  None    REASSESSMENT       **The patient has been re-evaluated and feeling much better and are stable for discharge**.  All available radiology and laboratory results have been reviewed with patient and/or available family.  Patient and/or family verbally conveyed their understanding and agreement of the patient's signs, symptoms, diagnosis, treatment and prognosis and additionally agree to follow-up as recommended in the discharge instructions or to return to the Emergency Department should their condition change or worsen prior to their follow-up appointment.  All questions have been answered and patient and/or available family express understanding.        PROCEDURES:  Unless otherwise noted below, none     Procedures      FINAL IMPRESSION    No diagnosis found.      DISPOSITION/PLAN   DISPOSITION        PATIENT REFERRED TO:  No follow-up provider specified.    DISCHARGE MEDICATIONS:  New Prescriptions    No medications on file         (Please note that portions of this note were completed with a voice recognition program.  Efforts were made to edit the dictations but occasionally words are mis-transcribed.)    Ellsworth Lennox, DO (electronically signed)  Emergency Attending Physician / Physician Assistant / Nurse Practitioner

## 2022-09-20 ENCOUNTER — Encounter

## 2023-03-20 ENCOUNTER — Encounter

## 2023-08-14 ENCOUNTER — Encounter: Payer: BLUE CROSS/BLUE SHIELD | Attending: Neurology

## 2023-08-30 ENCOUNTER — Ambulatory Visit: Admit: 2023-08-30 | Payer: BLUE CROSS/BLUE SHIELD | Attending: Neurology

## 2023-08-30 VITALS — BP 126/80 | HR 79 | Ht 67.0 in | Wt 208.0 lb

## 2023-08-30 DIAGNOSIS — G932 Benign intracranial hypertension: Principal | ICD-10-CM

## 2023-08-30 NOTE — Progress Notes (Signed)
 Neurology Clinic Follow up Note    Patient ID:  Erica Oconnor  [id number]  58 y.o.  07-02-1965      [name] is here for follow up today of  Chief Complaint   Patient presents with    OTHER     Pt returning for H/O IIH (idiopathic intracranial hypertension) Pt reports improvement with headaches only concern is dry eyes          Last Appointment With Me:  06/05/2022    Smt. Erica Oconnor is presenting for evaluation of headaches. She reports onset of headaches around age 37-24.   Location: Bi-frontal or over the vertex  Character: Pressure/throbbing  Intensity: On average 6/10   Frequency: Daily  # HA free days per month: 0  Duration: Several hours  Aura: Yes, visual  Associated Sx with HA: +nausea/vomiting, +phonophotophobia.   Neurological ROS: Denies focal weakness, numbness or vision loss associated with headaches. +blurred vision. +intermittent tinnitus, non-pulsatile. Prior dx IIH w/papilledema (Dr. Raymond). Prior LP with opening pressure>50cmH2O.   Systemic ROS:   +OSA untreated  Caffeine use: 4 cups daily   H/O Head trauma: None  Depressive or anxiety Sx: Yes, treated     Any change in pattern of HA? None    Triggers: Inadequate sleep. Improved when lying down or with rest  Alleviating factors: Rest/sleep, quiet   FHx HA/migraine: Mother    Treatment so far: Metoprolol, fluoxetine-ineffective. Using Fioricet daily for rescue headache treatment for the past 3 months.   Prior preventatives: SABRA Topamax, Gabapentin, Elavil, Vit B complex, Prozac, Norvasc, Clonidine, toprol, Botox injections (effective but botox not covered by her new insurance), Diamox   Prior rescue: Relpax, Fioricet, Imitrex, hydrocodone    Investigations so far:   11/15/21 MRI Brain/MRV no acute pathology, no VST/significant venous sinus stenosis  Interval History:   Headaches are overall improved, now very infrequent. Has persistent tinnitus, non-pulsatile, L>R.   Reports baseline blurred vision-following with Dr. Raymond and last seen a couple months  ago per pt. Per her understanding, papilledema appeared improved.   She has lost 80 lbs over the past year.   Taking Diamox  750mg  BID increased by Ophthalmology. No apparent side effects.     PMHx/ PSHx/ FHx/ SHx:  Reviewed and unchanged previous visit.   Past Medical History:   Diagnosis Date    Arthritis     Bursitis     COPD (chronic obstructive pulmonary disease) (HCC) 07/19/2016    Depression     Diastolic CHF (HCC)     GERD (gastroesophageal reflux disease)     Hearing loss     Heart failure (HCC)     Hypertension     Migraine     Morbid obesity (HCC)     Other ill-defined conditions(799.89)     Migraines, anemia    Psychiatric disorder     Depression, Anxiety    Unspecified sleep apnea     CPAP       ROS:  Comprehensive review of systems negative except for as noted above.       Objective:       Meds:  Current Outpatient Medications   Medication Sig Dispense Refill    sacubitril-valsartan (ENTRESTO) 24-26 MG per tablet Take 1 tablet by mouth 2 times daily      eszopiclone (LUNESTA) 1 MG TABS Take 1 tablet by mouth nightly. Max Daily Amount: 1 mg      WEGOVY 0.25 MG/0.5ML SOAJ SC injection Inject 0.25 mg into the skin every  7 days (Patient taking differently: Inject 2.4 mg into the skin every 7 days)      acetaZOLAMIDE  (DIAMOX ) 250 MG tablet Take 2 tablets by mouth 2 times daily (Patient taking differently: Take 3 tablets by mouth 2 times daily) 360 tablet 1    FLUoxetine (PROZAC) 40 MG capsule Take 2 capsules by mouth daily      gabapentin (NEURONTIN) 300 MG capsule Take 1 capsule by mouth 3 times daily.      spironolactone (ALDACTONE) 50 MG tablet Take 1 tablet by mouth 2 times daily (Patient taking differently: Take 12.5 mg by mouth 2 times daily)      metoprolol tartrate (LOPRESSOR) 50 MG tablet Take 1 tablet by mouth 2 times daily (Patient taking differently: Take 12.5 mg by mouth 2 times daily)      omeprazole (PRILOSEC) 20 MG delayed release capsule Take 2 capsules by mouth daily as needed      aspirin  81 MG chewable tablet Take 1 tablet by mouth daily      furosemide (LASIX) 40 MG tablet Take 1 tablet by mouth daily      rosuvastatin (CRESTOR) 10 MG tablet Take 1 tablet by mouth daily      hydrALAZINE (APRESOLINE) 100 MG tablet Take 1 tablet by mouth 3 times daily       No current facility-administered medications for this visit.       Exam:  BP 126/80   Pulse 79   Ht 1.702 m (5' 7)   Wt 94.3 kg (208 lb)   SpO2 97%   BMI 32.58 kg/m   NEUROLOGICAL EXAM:  General: Awake, alert, speech fluent  CN: B/L papilledema L>R, PERRL, EOMI without nystagmus, VFF to confrontation, facial sensation and strength are normal and symmetric, hearing is intact to finger rub bilaterally, palate and tongue movements are intact and symmetric.  Motor: Normal tone, bulk and strength bilaterally.  Reflexes: 2/4 and symmetric, plantar stimulation is flexor.  Coordination: FNF, RAM, HTS intact.  Sensation: LT intact throughout.  Gait: Normal-based and steady.      LABS  No visits with results within 6 Month(s) from this visit.   Latest known visit with results is:   Admission on 07/13/2022, Discharged on 07/13/2022   Component Date Value    SARS-CoV-2, PCR 07/13/2022 Not detected     Rapid Influenza A By PCR 07/13/2022 Not detected     Rapid Influenza B By PCR 07/13/2022 Not detected     Ventricular Rate 07/13/2022 52     Atrial Rate 07/13/2022 52     P-R Interval 07/13/2022 198     QRS Duration 07/13/2022 80     Q-T Interval 07/13/2022 400     QTc Calculation (Bazett) 07/13/2022 372     P Axis 07/13/2022 26     R Axis 07/13/2022 5     T Axis 07/13/2022 3     Diagnosis 07/13/2022                      Value:Sinus bradycardia  Possible Left atrial enlargement  Septal infarct , age undetermined  Abnormal ECG  When compared with ECG of 15-Feb-2012 23:55,  Septal infarct is now present  Confirmed by Benancio, M.D., Massimo ([id number]) on 07/14/2022 6:36:02 PM      WBC 07/13/2022 5.9     RBC 07/13/2022 3.81     Hemoglobin 07/13/2022 11.1 (L)      Hematocrit 07/13/2022 36.0     MCV 07/13/2022  94.5     MCH 07/13/2022 29.1     MCHC 07/13/2022 30.8     RDW 07/13/2022 13.6     Platelets 07/13/2022 209     MPV 07/13/2022 11.2     Nucleated RBCs 07/13/2022 0.0     nRBC 07/13/2022 0.00     Neutrophils % 07/13/2022 56     Lymphocytes % 07/13/2022 30     Monocytes % 07/13/2022 9     Eosinophils % 07/13/2022 4     Basophils % 07/13/2022 1     Immature Granulocytes % 07/13/2022 0     Neutrophils Absolute 07/13/2022 3.3     Lymphocytes Absolute 07/13/2022 1.7     Monocytes Absolute 07/13/2022 0.5     Eosinophils Absolute 07/13/2022 0.3     Basophils Absolute 07/13/2022 0.0     Immature Granulocytes Ab* 07/13/2022 0.0     Differential Type 07/13/2022 AUTOMATED     Sodium 07/13/2022 141     Potassium 07/13/2022 4.1     Chloride 07/13/2022 108     CO2 07/13/2022 24     Anion Gap 07/13/2022 9     Glucose 07/13/2022 101 (H)     BUN 07/13/2022 24 (H)     Creatinine 07/13/2022 1.16 (H)     BUN/Creatinine Ratio 07/13/2022 21 (H)     Est, Glom Filt Rate 07/13/2022 56 (L)     Calcium 07/13/2022 8.5     Total Bilirubin 07/13/2022 0.2     ALT 07/13/2022 13     AST 07/13/2022 11 (L)     Alk Phosphatase 07/13/2022 68     Total Protein 07/13/2022 6.9     Albumin 07/13/2022 3.3 (L)     Globulin 07/13/2022 3.6     Albumin/Globulin Ratio 07/13/2022 0.9 (L)     TSH, 3rd Generation 07/13/2022 0.81     T4 Free 07/13/2022 1.0     Magnesium 07/13/2022 2.4        IMAGING:  MRI Result (most recent):  MRI CARDIAC W WO CONTRAST 02/11/2023    Narrative  [facility]    CMR Report    MRN:                    [id number]  Name:             TESSLYN, BAUMERT  DOB:                  July 09, 1965  Scan Date:   2023-02-11 14:51:23    Electronically signed by Cathlyn Grates 2025-Jan-27 16:58:53    SUMMARY  ==========================================================================================================    Cardiac MRI with and without contrast was performed on a 3.0 T scanner to evaluate myocardial  morphology,  function, viability, and assess for etiology of non-ischemic cardiomyopathy in a 58 year old Black female  with history of heart failure preserved ejection fraction and increased septal wall thickness, 1.8 cm (EF  60-65% on 06/07/2022 TTE).    Findings:    1. The left ventricle is normal in cavity size, with mild hypertrophy (maximum wall thickness is 1.4 cm).  Global systolic function is normal. The LV ejection fraction is 69%. There are no regional wall motion  abnormalities.    2. The right ventricle is normal in cavity size, wall thickness, and systolic function.    3. Both atria are normal in size. The atrial septum is aneurysmal.    4. The aortic valve is trileaflet in morphology. There is no moderate-to-severe valvular disease.  5. T1 and T2 map values are within normal limits. Delayed enhancement imaging demonstrates no evidence of  myocardial infarction, scar or infiltrative disease.    CORE EXAM  ==========================================================================================================    MEASUREMENTS  ----------------------------------------------------------------------------------------  VOLUMETRIC ANALYSIS  ----------------------------------------------  .--------------------------------------------------------.                    LV    Reference  RV    Reference   +------+-----------+------+-----------+------+-----------+   EDV   ml          159   (86-166)   185   (81-166)          ml/m^2       72   (56-90)     84   (53-90)     ESV   ml           49   (22-59)     78   (15-68)           ml/m^2       22   (14-33)     35   (11-37)     CO    L/min      7.38             7.16                     L/min/m^2  3.33             3.23               MASS  g           151   (72-144)                           g/m^2        68   (48-78)                      SV    ml          110   (57-113)   107   (56-108)           ml/m^2       50   (37-62)     48   (36-60)     EF    %            69   (59-77)     58   (55-79)    '------+-----------+------+-----------+------+-----------'    CARDIAC OUTPUT HR:  67 BPM  LV DIMENSIONS  ----------------------------------------------  WALL THICKNESS - ANTEROSEPTAL:  1.4 cm  WALL THICKNESS - INFEROLATERAL:  0.7 cm  WALL THICKNESS - MAXIMUM:  1.4 cm  LV EDD:  5.1 cm  LV ESD:  3.3 cm    LA DIMENSIONS (LV SYSTOLE)  ----------------------------------------------  DIAMETER:  3.8 cm  AREA - 2 CHAMBER:  23.6 cm^2  LENGTH - 2 CHAMBER:  5.7 cm  AREA - 4 CHAMBER:  17.7 cm^2  LENGTH - 4 CHAMBER:  5.7 cm  VOLUME:  62 ml    AORTIC ROOT DIMENSIONS  ----------------------------------------------  ANNULUS:  2 cm  SINUS OF VALSALVA:  3.3 cm  SINOTUBULAR JUNCTION:  2.7 cm  AORTIC ROOT SIZE:  Normal      ANATOMY  ----------------------------------------------------------------------------------------  LEFT VENTRICLE  ----------------------------------------------  WALL THICKNESS:  MILD HYPERTROPHY  LVH PATTERN:  CONCENTRIC HYPERTROPHY  CAVITY SIZE:  Normal  MASS/THROMBUS:  None  RIGHT VENTRICLE  ----------------------------------------------  WALL THICKNESS:  Normal  CONTRACTILITY:  Normal  CAVITY SIZE:  Normal  MASS/THROMBUS:  None  PACEMAKER/DEFIBRILLATOR WIRE:  No    INTERVENTRICULAR SEPTUM  ----------------------------------------------  VENTRICULAR SEPTUM:  Normal    INTERATRIAL SEPTUM  ----------------------------------------------  ATRIAL SEPTUM: ANEURYSM    LEFT ATRIUM  ----------------------------------------------  CAVITY SIZE:  Normal  MASS/THROMBUS:  None    RIGHT ATRIUM  ----------------------------------------------  CAVITY SIZE:  Normal  MASS/THROMBUS:  None  PACEMAKER/DEFIBRILLATOR WIRE:  No    PERICARDIUM  ----------------------------------------------  Normal  EFFUSION:  Trivial  LOCATION:  CIRCUMFERENTIAL    PLEURAL EFFUSION  ----------------------------------------------  SMALL RIGHT,  SMALL LEFT      VALVES  ----------------------------------------------------------------------------------------  AORTIC VALVE  ----------------------------------------------  AORTIC VALVE LEAFLETS:  Normal Leaflets  AORTIC MASS/THROMBUS:  No Mass/Thrombus/Vegetation  AORTIC REGURGITATION:  None    MITRAL VALVE  ----------------------------------------------  MITRAL VALVE LEAFLETS:  Normal Leaflets  MITRAL MORPHOLOGY:  MILD THICKENING  MITRAL REGURGITATION:  Trivial  MITRAL STENOSIS:  None    TRICUSPID VALVE  ----------------------------------------------  TRICUSPID VALVE LEAFLETS:  Normal Leaflets  TRICUSPID MASS/THROMBUS:  No Mass/Thrombus/Vegetation  TRICUSPID REGURGITATION:  Trivial  TRICUSPID STENOSIS:  None      17 SEGMENT  ----------------------------------------------------------------------------------------  .------------------------------------------------------------------------------------------.   Segments            Wall Motion   Hyperenhancement  Stress Perfusion  Interpretation   +--------------------+--------------+------------------+------------------+----------------+   Base Anterior       Normal/Hyper  None                                                  Base Anteroseptal   Normal/Hyper  None                                                  Base Inferoseptal   Normal/Hyper  None                                                  Base Inferior       Normal/Hyper  None                                                  Base Inferolateral  Normal/Hyper  None                                                  Base Anterolateral  Normal/Hyper  None                                                  Mid Anterior        Normal/Hyper  None                                                  Mid Anteroseptal    Normal/Hyper  None                                                  Mid Inferoseptal    Normal/Hyper  None                                                   Mid Inferior        Normal/Hyper  None                                                  Mid Inferolateral   Normal/Hyper  None                                                  Mid Anterolateral   Normal/Hyper  None                                                  Apical Anterior     Normal/Hyper  None                                                  Apical Septal       Normal/Hyper  None                                                  Apical Inferior     Normal/Hyper  None                                                  Apical Lateral      Normal/Hyper  None                                                  Apex                Normal/Hyper  None                                                 +--------------------+--------------+------------------+------------------+----------------+  RV Segments         Wall Motion   Hyperenhancement                    Interpretation   +--------------------+--------------+------------------+------------------+----------------+   RV Basal Anterior   Normal/Hyper  None                                                  RV Basal Inferior   Normal/Hyper  None                                                  RV Mid              Normal/Hyper  None                                                  RV Apical           Normal/Hyper  None                                                 '--------------------+--------------+------------------+------------------+----------------'    FINDINGS  ----------------------------------------------  LV SCAR SIZE (17 SEGMENT):  0 %      SCAN INFO   ==========================================================================================================    GENERAL  ----------------------------------------------------------------------------------------  CONTRAST AGENT  ----------------------------------------------  TYPE:  Prohance   LOT NUMBER:  [id number]  EXPIRATION DATE:   2027-Jan-01  GD CONCENTRATION:  0.5 M  VOLUME ADMINISTERED:  40 ml  DOSAGE:  0.18 mmol/kg  SERUM CREATININE:  1.32 mg/dL  GFR:  46.45 fo/fpw/8.26f^7  CREATININE DATE:  2025-Jan-17    SEDATION  ----------------------------------------------  SEDATION USED?:  No    LAB RESULT  ----------------------------------------------  HEMATOCRIT:  37.5 %  HEMATOCRIT DATE:  2025-Jan-17    VITALS  ----------------------------------------------  HEIGHT:  67.01 in  SYSTOLIC BP:  107 mmHg  HEIGHT:  170.20 cm  DIASTOLIC BP:  67 mmHg  WEIGHT:  248.00 lbs  BASELINE HR:  76 BPM  WEIGHT:  112.49 kgs  BSA:  2.22 m^2    PULSE SEQUENCES  ----------------------------------------------  SSFP cine, GRE cine, 2D LGE segmented, 2D LGE single-shot, Pre-contrast T1 mapping, T2 mapping,  First-pass perfusion without stress, Phase contrast imaging, Fat-water imaging, HASTE morphology,  Bright-blood SSFP morphology    SETUP  ----------------------------------------------  SCAN TYPE:  Clinical  PATIENT TYPE:  Outpatient  INCOMPLETE SCAN:  No  REASON(S) FOR SCAN:  HCM (known/suspect)  REFERRING PHYSICIAN:  CARLEEN MAAS  TECHNOLOGIST:  Veva Cristopher Callas      BILLING  ----------------------------------------------------------------------------------------  Patient Account  [id number]  CPT Codes  ICD10 Codes  I50.22, I10, I51.7      Report generated by [employer], a product of [employer]      MRI BRAIN W WO CONTRAST 11/15/2021    Narrative  EXAM:  MRI BRAIN W WO CONTRAST, MRV HEAD WO CONTRAST    INDICATION:    Unspecified papilledema  COMPARISON:  None.    CONTRAST: 20 ml ProHance .    TECHNIQUE:  Multiplanar multisequence acquisition without and with contrast of the brain and  orbits.  Time-of-flight noncontrast MRV of the brain was performed. Multiplanar and MIP  reconstructions were obtained.    FINDINGS:  MRI Brain:  The globes are normal. In the posterior inferior intraconal fat of the right  orbit between the optic nerve  sheath and inferior rectus muscle, there is a  tubular STIR hyperintense, nonenhancing lesion which extends towards the orbital  apex, consistent with a varix. The optic nerves are normal in size and signal.  There is no significant enlargement of the optic nerve sheaths. The extraocular  muscles are normal. No other evidence of intraorbital mass or abnormal  intraorbital enhancement. The cavernous sinuses are unremarkable.    The ventricles are normal in size and position. The brain parenchyma has normal  signal characteristics for age, with minimal tiny scattered subcortical and deep  white matter T2/FLAIR hyperintensities that are of no clinical significance.  Incidental partially empty sella. There is no acute infarct, hemorrhage,  extra-axial fluid collection, or mass effect. There is no cerebellar tonsillar  herniation. Expected arterial flow-voids are present. No evidence of abnormal  enhancement.    Small retention cysts in bilateral maxillary sinuses. The mastoid air cells and  middle ears are clear. No significant osseous or scalp lesions are identified.    MRV Head:  The dural venous sinuses and deep cerebral veins are patent without evidence of  thrombosis. Codominant transverse sinuses, with no significant transverse sinus  stenosis.    Impression  MRI Brain:  1. No overt imaging evidence of intracranial hypertension. Incidental right  orbital varix. Otherwise no significant intraorbital abnormality.  2. No significant intracranial abnormality. Incidental partially empty sella.    MRV Head:  1. Normal MRV head.      MRI PELVIS W WO CONTRAST 11/15/2021    Narrative  MRI OF THE PELVIS WITH AND WITHOUT CONTRAST.    INDICATION: Uterine fibroids. Prolapsing uterine mass/fibroid.    COMPARISON: None.    TECHNIQUE: Multisequence and multiplanar MRI of the pelvis was performed before  and after the administration of 20 cc IV gadoteridol  (ProHance ).    FINDINGS:  The uterus is enlarged and replaced with multiple  fibroids. It measures 130 x 98  x 206 mm in greatest dimensions, and extends to the level of the L3 inferior  endplate, above the umbilicus. There is no visible endometrium or junctional  zone.    The largest fibroid is in the cervix and/or lower uterine segment. It measures  94 x 100 x 115 mm. It fills the inferior pelvis mediolaterally, with mass effect  and narrowing of the right greater than left common iliac veins (20-32),  anterior displacement and compression of the bladder (21-20), and splaying of  the vaginal fornices (21-20).    No pelvic lymphadenopathy. There is trace free fluid. The visualized portions of  the bowel are normal.    Impression  1. Uterus enlarged and replaced with fibroids.  2. Large cervical/lower uterine segment fibroid, with mass effect as described  above.        Assessment:       ICD-10-CM    1. IIH (idiopathic intracranial hypertension)  G93.2       2. Papilledema of both eyes  H47.10       58 y.o. female with h/o HTN, OSA, depression returning for f/u of headaches since age 71  with associated nausea, emesis, photophobia, blurred vision. H/O IIH confirmed s/p lumbar puncture (UVA) with opening pressure 52cmH2O. MRI Brain/MRV were reviewed from 11/15/21 without acute intracranial pathology/mass, venous sinus stenosis/thrombosis. Neurological examination reveals b/l papilledema L>R, otherwise non-focal. She is followed by Ophthalmology for ongoing monitoring (Dr. Raymond). Lumbar puncture repeated 11/2021 with opening pressure 35cm H2O c/w IIH. Discussed risk factors for IIH including BMI of 42 at initial visit and importance of continued weight loss. Headaches appear much improved since last visit. She will continue Diamox  as managed by Ophthalmology for treatment of IIH. She has noted some decrease in visual acuity more recently-advised more urgent re-evaluation by Ophthalmology to determine if any interval change in papilledema.   Plan:   Continue Diamox  750mg  BID for IIH  Advised  continued weight loss  Ophthalmology f/u for formal visual field testing and re-evaluation of papilledema    Return in about 6 months (around 03/01/2024).    I have discussed the diagnosis with the patient today and the intended plan as seen in the above orders with both the patient as well as referring provider and/or PCP via electronic correspondence. The patient has received an after-visit summary and questions were answered concerning future plans. I have discussed medication side effects and warnings with the patient as well.      Signed:  Vernell Geralds, DO  08/30/2023  10:17 AM
